# Patient Record
Sex: Male | Born: 1945 | Race: White | Hispanic: No | Marital: Married | Smoking: Never smoker
Health system: Southern US, Community
[De-identification: ages and names within clinical notes are randomized; demographics above are authoritative.]

## PROBLEM LIST (undated history)

## (undated) DIAGNOSIS — E782 Mixed hyperlipidemia: Secondary | ICD-10-CM

## (undated) DIAGNOSIS — N400 Enlarged prostate without lower urinary tract symptoms: Secondary | ICD-10-CM

## (undated) DIAGNOSIS — G2581 Restless legs syndrome: Secondary | ICD-10-CM

## (undated) DIAGNOSIS — I10 Essential (primary) hypertension: Secondary | ICD-10-CM

## (undated) DIAGNOSIS — I209 Angina pectoris, unspecified: Secondary | ICD-10-CM

## (undated) DIAGNOSIS — I319 Disease of pericardium, unspecified: Secondary | ICD-10-CM

## (undated) DIAGNOSIS — K219 Gastro-esophageal reflux disease without esophagitis: Secondary | ICD-10-CM

## (undated) DIAGNOSIS — H269 Unspecified cataract: Secondary | ICD-10-CM

## (undated) DIAGNOSIS — G4730 Sleep apnea, unspecified: Secondary | ICD-10-CM

## (undated) HISTORY — DX: Mixed hyperlipidemia: E78.2

## (undated) HISTORY — DX: Disease of pericardium, unspecified: I31.9

## (undated) HISTORY — DX: Unspecified cataract: H26.9

## (undated) HISTORY — DX: Benign prostatic hyperplasia without lower urinary tract symptoms: N40.0

## (undated) HISTORY — PX: CARDIAC CATHETERIZATION: SHX172

## (undated) HISTORY — DX: Gastro-esophageal reflux disease without esophagitis: K21.9

## (undated) HISTORY — DX: Essential (primary) hypertension: I10

## (undated) HISTORY — DX: Restless legs syndrome: G25.81

## (undated) HISTORY — DX: Angina pectoris, unspecified: I20.9

## (undated) HISTORY — DX: Sleep apnea, unspecified: G47.30

---

## 2004-07-09 ENCOUNTER — Ambulatory Visit (HOSPITAL_COMMUNITY): Admission: RE | Admit: 2004-07-09 | Discharge: 2004-07-09 | Payer: Self-pay | Admitting: Orthopedic Surgery

## 2009-07-07 ENCOUNTER — Encounter: Payer: Self-pay | Admitting: Cardiovascular Disease

## 2009-12-21 ENCOUNTER — Ambulatory Visit (HOSPITAL_BASED_OUTPATIENT_CLINIC_OR_DEPARTMENT_OTHER): Admission: RE | Admit: 2009-12-21 | Discharge: 2009-12-21 | Payer: Self-pay | Admitting: Plastic Surgery

## 2010-01-11 ENCOUNTER — Encounter: Payer: Self-pay | Admitting: Cardiovascular Disease

## 2010-01-11 ENCOUNTER — Ambulatory Visit: Payer: Self-pay | Admitting: Cardiovascular Disease

## 2010-01-11 DIAGNOSIS — I209 Angina pectoris, unspecified: Secondary | ICD-10-CM

## 2010-01-11 HISTORY — DX: Angina pectoris, unspecified: I20.9

## 2010-01-12 ENCOUNTER — Encounter: Payer: Self-pay | Admitting: Cardiovascular Disease

## 2010-01-12 ENCOUNTER — Telehealth: Payer: Self-pay | Admitting: Cardiovascular Disease

## 2010-01-31 ENCOUNTER — Ambulatory Visit: Payer: Self-pay | Admitting: Cardiovascular Disease

## 2010-01-31 LAB — CONVERTED CEMR LAB
Basophils Relative: 0.5 % (ref 0.0–3.0)
Chloride: 101 meq/L (ref 96–112)
HCT: 40.8 % (ref 39.0–52.0)
Hemoglobin: 13.8 g/dL (ref 13.0–17.0)
Lymphocytes Relative: 42.1 % (ref 12.0–46.0)
Lymphs Abs: 2.9 10*3/uL (ref 0.7–4.0)
MCHC: 33.9 g/dL (ref 30.0–36.0)
MCV: 95.1 fL (ref 78.0–100.0)
Monocytes Absolute: 0.6 10*3/uL (ref 0.1–1.0)
Monocytes Relative: 8.7 % (ref 3.0–12.0)
Neutrophils Relative %: 45.5 % (ref 43.0–77.0)
Platelets: 208 10*3/uL (ref 150.0–400.0)
Potassium: 4.4 meq/L (ref 3.5–5.1)
Prothrombin Time: 10.5 s (ref 9.7–11.8)
RDW: 13.1 % (ref 11.5–14.6)
Sodium: 139 meq/L (ref 135–145)

## 2010-02-03 ENCOUNTER — Ambulatory Visit (HOSPITAL_COMMUNITY)
Admission: RE | Admit: 2010-02-03 | Discharge: 2010-02-03 | Payer: Self-pay | Source: Home / Self Care | Attending: Cardiovascular Disease | Admitting: Cardiovascular Disease

## 2010-03-07 NOTE — Letter (Signed)
Summary: Cardiac Catheterization Instructions- Main Lab  [employer], Main Office  [address]   [place], [place] [address]   Phone: [phone]  Fax: [phone]     01/12/2010 MRN: [id number]  [facility] [address] [place], [place]  [address]  Dear Mr. CAPPELLETTI,   You are scheduled for Cardiac Catheterization on 02/03/10 with Dr. Excell Seltzer.  Please arrive at the [facility] at 8:30      a.m. on the day of your procedure.  1. DIET     __x_ Nothing to eat or drink after midnight except your medications with a sip of water.  2. Come to the [place] office on  Tuesday 01/31/10 for lab work.  The lab at [facility] is open from 8:30 a.m. to 1:30 p.m. and 2:30 p.m. to 5:00 p.m.  The lab at [facility] is open from 7:30 a.m. to 5:30 p.m.  You do not have to be fasting.  3. MAKE SURE YOU TAKE YOUR ASPIRIN.      __x__ YOU MAY TAKE ALL of your remaining medications with a small amount of water.   4. Plan for one night stay - bring personal belongings (i.e. toothpaste, toothbrush, etc.)  5. Bring a current list of your medications and current insurance cards.  6. Must have a responsible person to drive you home.   7. Someone must be with yu for the first 24 hours after you arrive home.  8. Please wear clothes that are easy to get on and off and wear slip-on shoes.  *Special note: Every effort is made to have your procedure done on time.  Occasionally there are emergencies that present themselves at the hospital that may cause delays.  Please be patient if a delay does occur.  If you have any questions after you get home, please call the office at the number listed above.  Whitney Maeola Sarah RN

## 2010-03-07 NOTE — Progress Notes (Signed)
Summary: calling to rs cath  Phone Note Call from Patient Call back at [phone]   Caller: Daughter/Randy Ramos Summary of Call: Pt calling to rs cath Initial call taken by: Judie Grieve,  January 12, 2010 2:07 PM  Follow-up for Phone Call        cath. sch. for 02/03/10 with Dr. Excell Seltzer in the main cath lab. He will get blood work drawn 01/31/10 @ the office. I will mail instructions to the patient. Whitney Maeola Sarah RN  January 12, 2010 2:35 PM  Follow-up by: Whitney Maeola Sarah RN,  January 12, 2010 2:35 PM

## 2010-03-07 NOTE — Assessment & Plan Note (Signed)
Summary: np3/sob,tightness   Visit Type:  Initial Consult Primary Lewis Keats:  Consuello Masse  CC:  Chest pressure/tightness on exertion- last episode x 1 week ago.  History of Present Illness: 65 year-old gentleman presents for initial evaluation of chest pain. He reports exertional chest pressure in the substernal area over at least the past 6 months, slowly progressive at lower level exercise. He denies rest pain or pain with low-level activity such as walking on level ground.  For the last 6 months he gets midsternal discomfort which makes him stop exercising. It resolves within 1 min. Never happens when he is not exercising. Walking does not hurt unless going up hill, would likely start having tightness after 3 flights of stairs. No h/o leg pain while walking. Pt says that he snores at night and wakes up feeling tired. Had an ETT a few years ago and then had a nuclear stress test in 2006. It wa normal per patient but the medication they gave him made him feel "sick" and his BP dropped very low during the test. He feels like his symptoms are getting a little worse over the last few months.   He has mild associated dyspnea, but denies nausea, diaphoresis, palps, or other associated symptoms.  Current Medications (verified): 1)  Nexium 40 Mg Cpdr (Esomeprazole Magnesium) .... Take 1 By Mouth Once Daily 2)  Finasteride 5 Mg Tabs (Finasteride) .... Take 1 By Mouth Once Daily 3)  Sertraline Hcl 50 Mg Tabs (Sertraline Hcl) .... Take 1 By Mouth Once Daily 4)  Tamsulosin Hcl 0.4 Mg Caps (Tamsulosin Hcl) .... Take 1 By Mouth Once Daily 5)  Lunesta 2 Mg Tabs (Eszopiclone) .... Take 1 By Mouth At Bedtime As Needed 6)  Phenazopyridine Hcl 100 Mg Tabs (Phenazopyridine Hcl) .... Take 1 By Mouth Every 8 Hours As Needed 7)  Multivitamins  Tabs (Multiple Vitamin) .... Take Q By Mouth Three Times A Day 8)  Goodys Body Pain 500-325 Mg Pack (Aspirin-Acetaminophen) .... Once Daily  Allergies (verified): No  Known Drug Allergies  Past History:  Past Medical History: Sob Chest tightness h/o bronchitis fatigue reflux urinary problems pericarditis 1995  Past Surgical History: nose surgery 1971 thumb 12-21-2009  Family History: brother has a "hole in his heart" mom: deceased at 39 (had MI before age 11) dad: deceased at 66 (stroke) 2 sisters: 1 alive, 1 deceased at age 46 (breast cancer) 2 brothers: 1 alive, 1 deceased at age 73 (MI)  Social History: no exercise, no smoking, no drugs, no alcohol, lots of stress at job doing the work of 2, plans to retire late next spring (probably before or in May, 2012).   Review of Systems       Negative except as per HPI   Vital Signs:  Patient profile:   65 year old male Height:      70 inches Weight:      196 pounds BMI:     28.22 Pulse rate:   68 / minute Pulse rhythm:   regular Resp:     12 per minute BP sitting:   132 / 78  (left arm) Cuff size:   regular  Vitals Entered By: Stanton Kidney, EMT-P (January 11, 2010 2:45 PM)  Physical Exam  General:  Pt is alert and oriented, in no acute distress. HEENT: normal Neck: normal carotid upstrokes without bruits, JVP normal Lungs: CTA CV: RRR without murmur or gallop Abd: soft, NT, positive BS, no bruit, no organomegaly Ext: no clubbing, cyanosis, or  edema. peripheral pulses 2+ and equal Skin: warm and dry without rash    EKG  Procedure date:  01/11/2010  Findings:      NSR with nonspecific T wave abnormality 68 bpm.  Impression & Recommendations:  Problem # 1:  ANGINA, STABLE (ICD-413.9) Assessment New 65 year-old male wiht highly typical symptoms of angina with exertion, progressive over the past few months. He has a high pretest probablity of CAD based on symptoms, hyperlipidemia, and family history of CAD. I have recommended cardiac catheterizaiton for definitive evaluation.  Plan to start Metoprolol 25 mg BId, ASA 325 mg daily and Nitro for angina that doesn't  resolve. Risks, benefits, and alternatives to catheterization were discussed with the patient in detail.  Pt wants to discuss cath with his wife and will make a decision and call back to make an appointment time. He was given cath instructions today.   His updated medication list for this problem includes:    Aspirin Ec 325 Mg Tbec (Aspirin) .Marland Kitchen... Take one tablet by mouth daily    Metoprolol Tartrate 25 Mg Tabs (Metoprolol tartrate) .Marland Kitchen... Take one tablet by mouth twice a day    Nitrostat 0.4 Mg Subl (Nitroglycerin) .Marland Kitchen... 1 tablet under tongue at onset of chest pain; you may repeat every 5 minutes for up to 3 doses.  Orders: EKG w/ Interpretation (93[address]) Cardiac Catheterization (Cardiac Cath)  Problem # 2:  HYPERCHOLESTEROLEMIA (ICD-272.0) Lipids reviewed from June 2011 showing Chol 176, HDL 37, and LDL 123. Will review at cath and if he has CAD will start a statin at that time.  Orders: EKG w/ Interpretation (93[address]) Cardiac Catheterization (Cardiac Cath)  Patient Instructions: 1)  Your physician has recommended you make the following change in your medication: START Aspirin 325mg  once a day, START Metoprolol tartrate 25mg  take one tablet by mouth two times a day, Nitroglycerin as directed  2)  Your physician has requested that you have a cardiac catheterization.  Cardiac catheterization is used to diagnose and/or treat various heart conditions. Doctors may recommend this procedure for a number of different reasons. The most common reason is to evaluate chest pain. Chest pain can be a symptom of coronary artery disease (CAD), and cardiac catheterization can show whether plaque is narrowing or blocking your heart's arteries. This procedure is also used to evaluate the valves, as well as measure the blood flow and oxygen levels in different parts of your heart.  For further information please visit [email or web address]  Please follow instruction sheet, as given. (THE PT WILL CALL BACK TO SCHEDULE  AFTER SPEAKING WITH FAMILY) Prescriptions: NITROSTAT 0.4 MG SUBL (NITROGLYCERIN) 1 tablet under tongue at onset of chest pain; you may repeat every 5 minutes for up to 3 doses.  #25 x 3   Entered by:   Julieta Gutting, RN, BSN   Authorized by:   Norva Karvonen, MD   Signed by:   Julieta Gutting, RN, BSN on 01/11/2010   Method used:   Electronically to        [facility]  [id number] (retail)       [address]       [place], [place]  [address]       Ph: [phone] or [phone]       Fax: [phone]   RxID:   [id number] METOPROLOL TARTRATE 25 MG TABS (METOPROLOL TARTRATE) Take one tablet by mouth twice a day  #60 x 3   Entered by:   Julieta Gutting, RN, BSN  Authorized by:   Norva Karvonen, MD   Signed by:   Julieta Gutting, RN, BSN on 01/11/2010   Method used:   [facility]ronically to        [facility]  [id number] (retail)       [address]       [place], [place]  [address]       Ph: [phone] or [phone]       Fax: [phone]   RxID:   [id number]

## 2010-03-09 NOTE — Cardiovascular Report (Signed)
Summary: Pre-Cath Orders  Pre-Cath Orders   Imported By: Marylou Mccoy 01/23/2010 18:47:04  _____________________________________________________________________  External Attachment:    Type:   Image     Comment:   External Document

## 2010-04-19 LAB — FUNGUS CULTURE W SMEAR
Fungal Smear: NONE SEEN
Fungal Smear: NONE SEEN

## 2010-04-19 LAB — TISSUE CULTURE
Gram Stain: NONE SEEN
Gram Stain: NONE SEEN

## 2010-04-19 LAB — AFB CULTURE WITH SMEAR (NOT AT ARMC): Acid Fast Smear: NONE SEEN

## 2010-04-19 LAB — ANAEROBIC CULTURE: Gram Stain: NONE SEEN

## 2015-02-02 LAB — HM COLONOSCOPY

## 2015-04-27 DIAGNOSIS — G2581 Restless legs syndrome: Secondary | ICD-10-CM | POA: Diagnosis not present

## 2015-04-27 DIAGNOSIS — J329 Chronic sinusitis, unspecified: Secondary | ICD-10-CM | POA: Diagnosis not present

## 2015-04-27 DIAGNOSIS — G4700 Insomnia, unspecified: Secondary | ICD-10-CM | POA: Diagnosis not present

## 2015-07-29 DIAGNOSIS — N401 Enlarged prostate with lower urinary tract symptoms: Secondary | ICD-10-CM | POA: Diagnosis not present

## 2015-07-29 DIAGNOSIS — R972 Elevated prostate specific antigen [PSA]: Secondary | ICD-10-CM | POA: Diagnosis not present

## 2015-07-29 DIAGNOSIS — R351 Nocturia: Secondary | ICD-10-CM | POA: Diagnosis not present

## 2015-09-15 DIAGNOSIS — T07 Unspecified multiple injuries: Secondary | ICD-10-CM | POA: Diagnosis not present

## 2015-09-15 DIAGNOSIS — T63444A Toxic effect of venom of bees, undetermined, initial encounter: Secondary | ICD-10-CM | POA: Diagnosis not present

## 2015-10-28 DIAGNOSIS — Z0000 Encounter for general adult medical examination without abnormal findings: Secondary | ICD-10-CM | POA: Diagnosis not present

## 2015-10-28 DIAGNOSIS — J40 Bronchitis, not specified as acute or chronic: Secondary | ICD-10-CM | POA: Diagnosis not present

## 2015-11-23 DIAGNOSIS — Z79899 Other long term (current) drug therapy: Secondary | ICD-10-CM | POA: Diagnosis not present

## 2015-11-23 DIAGNOSIS — N401 Enlarged prostate with lower urinary tract symptoms: Secondary | ICD-10-CM | POA: Diagnosis not present

## 2015-11-23 DIAGNOSIS — Z23 Encounter for immunization: Secondary | ICD-10-CM | POA: Diagnosis not present

## 2015-11-23 DIAGNOSIS — G2581 Restless legs syndrome: Secondary | ICD-10-CM | POA: Diagnosis not present

## 2015-11-23 DIAGNOSIS — I2510 Atherosclerotic heart disease of native coronary artery without angina pectoris: Secondary | ICD-10-CM | POA: Diagnosis not present

## 2015-11-23 DIAGNOSIS — I10 Essential (primary) hypertension: Secondary | ICD-10-CM | POA: Diagnosis not present

## 2015-11-23 DIAGNOSIS — M5416 Radiculopathy, lumbar region: Secondary | ICD-10-CM | POA: Diagnosis not present

## 2015-11-23 DIAGNOSIS — R7309 Other abnormal glucose: Secondary | ICD-10-CM | POA: Diagnosis not present

## 2015-11-23 DIAGNOSIS — E782 Mixed hyperlipidemia: Secondary | ICD-10-CM | POA: Diagnosis not present

## 2015-11-23 DIAGNOSIS — J309 Allergic rhinitis, unspecified: Secondary | ICD-10-CM | POA: Diagnosis not present

## 2015-11-23 DIAGNOSIS — R972 Elevated prostate specific antigen [PSA]: Secondary | ICD-10-CM | POA: Diagnosis not present

## 2015-11-23 DIAGNOSIS — K219 Gastro-esophageal reflux disease without esophagitis: Secondary | ICD-10-CM | POA: Diagnosis not present

## 2015-12-12 DIAGNOSIS — G4733 Obstructive sleep apnea (adult) (pediatric): Secondary | ICD-10-CM | POA: Diagnosis not present

## 2016-02-15 DIAGNOSIS — J101 Influenza due to other identified influenza virus with other respiratory manifestations: Secondary | ICD-10-CM | POA: Diagnosis not present

## 2016-02-15 DIAGNOSIS — L209 Atopic dermatitis, unspecified: Secondary | ICD-10-CM | POA: Diagnosis not present

## 2016-02-15 DIAGNOSIS — R509 Fever, unspecified: Secondary | ICD-10-CM | POA: Diagnosis not present

## 2016-07-05 DIAGNOSIS — M25562 Pain in left knee: Secondary | ICD-10-CM | POA: Diagnosis not present

## 2016-07-20 DIAGNOSIS — N401 Enlarged prostate with lower urinary tract symptoms: Secondary | ICD-10-CM | POA: Diagnosis not present

## 2016-07-20 DIAGNOSIS — R972 Elevated prostate specific antigen [PSA]: Secondary | ICD-10-CM | POA: Diagnosis not present

## 2016-07-20 DIAGNOSIS — R351 Nocturia: Secondary | ICD-10-CM | POA: Diagnosis not present

## 2016-08-02 DIAGNOSIS — G4733 Obstructive sleep apnea (adult) (pediatric): Secondary | ICD-10-CM | POA: Diagnosis not present

## 2016-09-13 DIAGNOSIS — L300 Nummular dermatitis: Secondary | ICD-10-CM | POA: Diagnosis not present

## 2016-09-13 DIAGNOSIS — L299 Pruritus, unspecified: Secondary | ICD-10-CM | POA: Diagnosis not present

## 2016-09-13 DIAGNOSIS — D225 Melanocytic nevi of trunk: Secondary | ICD-10-CM | POA: Diagnosis not present

## 2016-09-13 DIAGNOSIS — L820 Inflamed seborrheic keratosis: Secondary | ICD-10-CM | POA: Diagnosis not present

## 2016-09-13 DIAGNOSIS — D1801 Hemangioma of skin and subcutaneous tissue: Secondary | ICD-10-CM | POA: Diagnosis not present

## 2016-09-18 DIAGNOSIS — J358 Other chronic diseases of tonsils and adenoids: Secondary | ICD-10-CM | POA: Diagnosis not present

## 2016-09-18 DIAGNOSIS — R490 Dysphonia: Secondary | ICD-10-CM | POA: Diagnosis not present

## 2016-09-18 DIAGNOSIS — J3489 Other specified disorders of nose and nasal sinuses: Secondary | ICD-10-CM | POA: Diagnosis not present

## 2016-09-18 DIAGNOSIS — K219 Gastro-esophageal reflux disease without esophagitis: Secondary | ICD-10-CM | POA: Diagnosis not present

## 2016-10-18 DIAGNOSIS — J358 Other chronic diseases of tonsils and adenoids: Secondary | ICD-10-CM | POA: Diagnosis not present

## 2016-10-18 DIAGNOSIS — R490 Dysphonia: Secondary | ICD-10-CM | POA: Diagnosis not present

## 2016-11-15 DIAGNOSIS — Z0001 Encounter for general adult medical examination with abnormal findings: Secondary | ICD-10-CM | POA: Diagnosis not present

## 2016-11-15 DIAGNOSIS — G2581 Restless legs syndrome: Secondary | ICD-10-CM | POA: Diagnosis not present

## 2016-11-15 DIAGNOSIS — M545 Low back pain: Secondary | ICD-10-CM | POA: Diagnosis not present

## 2016-11-15 DIAGNOSIS — R7309 Other abnormal glucose: Secondary | ICD-10-CM | POA: Diagnosis not present

## 2016-11-15 DIAGNOSIS — J329 Chronic sinusitis, unspecified: Secondary | ICD-10-CM | POA: Diagnosis not present

## 2016-11-15 DIAGNOSIS — N401 Enlarged prostate with lower urinary tract symptoms: Secondary | ICD-10-CM | POA: Diagnosis not present

## 2016-11-21 DIAGNOSIS — R490 Dysphonia: Secondary | ICD-10-CM | POA: Diagnosis not present

## 2017-01-02 ENCOUNTER — Encounter: Payer: Self-pay | Admitting: Cardiovascular Disease

## 2017-01-02 ENCOUNTER — Encounter (INDEPENDENT_AMBULATORY_CARE_PROVIDER_SITE_OTHER): Payer: Self-pay

## 2017-01-02 ENCOUNTER — Ambulatory Visit: Payer: PPO | Admitting: Cardiovascular Disease

## 2017-01-02 VITALS — BP 138/80 | HR 74 | Ht 69.0 in | Wt 207.2 lb

## 2017-01-02 DIAGNOSIS — I208 Other forms of angina pectoris: Secondary | ICD-10-CM | POA: Diagnosis not present

## 2017-01-02 DIAGNOSIS — R0602 Shortness of breath: Secondary | ICD-10-CM

## 2017-01-02 NOTE — Patient Instructions (Signed)
Medication Instructions:  Your provider recommends that you continue on your current medications as directed. Please refer to the Current Medication list given to you today.    Labwork: None  Testing/Procedures: Your physician has requested that you have a stress echocardiogram. For further information please visit [email or web address]. Please follow instruction sheet as given.  Follow-Up: Your provider wants you to follow-up in: 1 year with Dr. Burt Knack. You will receive a reminder letter in the mail two months in advance. If you don't receive a letter, please call our office to schedule the follow-up appointment.    Any Other Special Instructions Will Be Listed Below (If Applicable).     If you need a refill on your cardiac medications before your next appointment, please call your pharmacy.

## 2017-01-02 NOTE — Progress Notes (Signed)
Cardiology Office Note Date:  01/02/2017   ID:  Randy, Ramos 05-Apr-1945, MRN [id number]  PCP:  Randy Broker, MD  Cardiologist:  Randy Mocha, MD    Chief Complaint  Patient presents with  . Palpitations     History of Present Illness: Randy Ramos is a 71 y.o. male who presents to reestablish cardiac care.  He was last seen in 2011.  He presented with exertional angina with highly typical symptoms.  Considering his high pretest probability of CAD, cardiac catheterization was recommended.  This demonstrated minimal nonobstructive coronary artery disease and normal LV function.  With physical work he complains of exertional dyspnea.  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  He would be short of breath with walking 1-2 flights of stairs, but states he can walk well on level ground.  He denies chest pain or pressure.  He denies orthopnea, PND, leg swelling, or heart palpitations.  His breathing is worse over the last 6-12 months.  Past Medical History:  Diagnosis Date  . ANGINA, STABLE 01/11/2010   Qualifier: Diagnosis of  By: Owens Shark, RN, BSN, Randy        Current Outpatient Medications  Medication Sig Dispense Refill  . Coenzyme Q10-Vitamin E (QUNOL ULTRA COQ10 PO) Take 1 capsule by mouth daily.    Marland Kitchen ezetimibe (ZETIA) 10 MG tablet Take 10 mg by mouth daily.  2  . finasteride (PROSCAR) 5 MG tablet Take 5 mg by mouth daily.  3  . metoprolol succinate (TOPROL-XL) 25 MG 24 hr tablet Take 25 mg by mouth daily.  3  . Omega-3 Fatty Acids (FISH OIL PO) Take 1 capsule by mouth daily.    . pantoprazole (PROTONIX) 40 MG tablet Take 40 mg by mouth daily.    . pramipexole (MIRAPEX) 0.125 MG tablet Take 1-4 tablets by mouth at bedtime.  0  . tamsulosin (FLOMAX) 0.4 MG CAPS capsule Take 0.8 mg by mouth at bedtime.  3  . traMADol (ULTRAM) 50 MG tablet Take 50 mg by mouth every 8 (eight) hours as needed for pain.  0   No current  facility-administered medications for this visit.     Allergies:   Patient has no allergy information on record.   Social History:  The patient  reports that  has never smoked. He has quit using smokeless tobacco. He reports that he does not drink alcohol.   Family History:  The patient's family history includes Hypertension in his father and mother.    ROS:  Please see the history of present illness.  Otherwise, review of systems is positive for snoring, back pain.  All other systems are reviewed and negative.    PHYSICAL EXAM: VS:  BP 138/80   Pulse 74   Ht 5\' 9"  (1.753 m)   Wt 207 lb 3.2 oz (94 kg)   BMI 30.60 kg/m  , BMI Body mass index is 30.6 kg/m. GEN: Well nourished, well developed, in no acute distress  HEENT: normal  Neck: no JVD, no masses. No carotid bruits Cardiac: RRR without murmur or gallop      Respiratory:  clear to auscultation bilaterally, normal work of breathing GI: soft, nontender, nondistended, + BS MS: no deformity or atrophy  Ext: no pretibial edema, pedal pulses 2+= bilaterally Skin: warm and dry, no rash Neuro:  Strength and sensation are intact Psych: euthymic mood, full affect  EKG:  EKG is ordered  today. The ekg ordered today shows NSR 74 bpm, minimal voltage criteria for LVH, otherwise normal  Recent Labs: No results found for requested labs within last 8760 hours.   Lipid Panel  No results found for: CHOL, TRIG, HDL, CHOLHDL, VLDL, LDLCALC, LDLDIRECT    Wt Readings from Last 3 Encounters:  01/02/17 207 lb 3.2 oz (94 kg)     Cardiac Studies Reviewed: Cardiac Cath 2011: Study reviewed with 30% proximal LAD stenosis, otherwise widely patent coronary arteries.  Normal LV function.  ASSESSMENT AND PLAN: 1.  Exertional dyspnea: previous evaluation reviewed, specifically cardiac cath findings. Last testing done in 2011, now with exertional dyspnea. I have recommended a stress echo to further evaluate causes of exertional dyspnea and rule  out myocardial ischemia. Exam/symptoms not suggestive of lung disease.   2. HTN: controlled. Continue same Rx.   3. Hyperlipidemia: followed by Dr Randy Ramos. Pt has problems with statin intolerance. Taking CoQ10 and low-dose statin intermittently.   Current medicines are reviewed with the patient today.  The patient does not have concerns regarding medicines.  Labs/ tests ordered today include:   Orders Placed This Encounter  Procedures  . EKG 12-Lead  . ECHOCARDIOGRAM STRESS TEST    Disposition:   FU one year  Signed, Randy Mocha, MD  01/02/2017 5:37 PM    Randy Ramos, Randy Ramos, Randy Ramos  [address] Phone: [phone]; Fax: [phone]

## 2017-01-21 DIAGNOSIS — J3089 Other allergic rhinitis: Secondary | ICD-10-CM | POA: Diagnosis not present

## 2017-01-21 DIAGNOSIS — M5431 Sciatica, right side: Secondary | ICD-10-CM | POA: Diagnosis not present

## 2017-02-07 ENCOUNTER — Telehealth (HOSPITAL_COMMUNITY): Payer: Self-pay | Admitting: *Deleted

## 2017-02-07 NOTE — Telephone Encounter (Signed)
Left message on voicemail in reference to upcoming appointment scheduled for 02/14/16. Phone number given for a call back so details instructions can be given. Randy Ramos

## 2017-02-13 ENCOUNTER — Ambulatory Visit (HOSPITAL_COMMUNITY): Payer: PPO

## 2017-02-13 ENCOUNTER — Encounter (INDEPENDENT_AMBULATORY_CARE_PROVIDER_SITE_OTHER): Payer: Self-pay

## 2017-02-13 ENCOUNTER — Ambulatory Visit (HOSPITAL_COMMUNITY): Payer: PPO | Attending: Cardiovascular Disease

## 2017-02-13 ENCOUNTER — Telehealth: Payer: Self-pay | Admitting: Cardiovascular Disease

## 2017-02-13 DIAGNOSIS — R000 Tachycardia, unspecified: Secondary | ICD-10-CM | POA: Diagnosis not present

## 2017-02-13 DIAGNOSIS — R0602 Shortness of breath: Secondary | ICD-10-CM

## 2017-02-13 DIAGNOSIS — I420 Dilated cardiomyopathy: Secondary | ICD-10-CM | POA: Diagnosis not present

## 2017-02-13 DIAGNOSIS — I208 Other forms of angina pectoris: Secondary | ICD-10-CM | POA: Diagnosis not present

## 2017-02-13 MED ORDER — METOPROLOL SUCCINATE ER 25 MG PO TB24: 25.0000 mg | ORAL_TABLET | Freq: Every day | ORAL | 3 refills | Status: DC

## 2017-02-13 NOTE — Telephone Encounter (Signed)
Rx refilled.

## 2017-02-13 NOTE — Telephone Encounter (Signed)
New message     *STAT* If patient is at the pharmacy, call can be transferred to refill team.   1. Which medications need to be refilled? (please list name of each medication and dose if known) metoprolol 25 mg  2. Which pharmacy/location (including street and city if local pharmacy) is medication to be sent to? [facility] [place] [phone]  3. Do they need a 30 day or 90 day supply? 90 day

## 2017-03-03 DIAGNOSIS — J069 Acute upper respiratory infection, unspecified: Secondary | ICD-10-CM | POA: Diagnosis not present

## 2017-03-03 DIAGNOSIS — J111 Influenza due to unidentified influenza virus with other respiratory manifestations: Secondary | ICD-10-CM | POA: Diagnosis not present

## 2017-08-09 DIAGNOSIS — M170 Bilateral primary osteoarthritis of knee: Secondary | ICD-10-CM | POA: Diagnosis not present

## 2017-09-04 DIAGNOSIS — N401 Enlarged prostate with lower urinary tract symptoms: Secondary | ICD-10-CM | POA: Diagnosis not present

## 2017-09-04 DIAGNOSIS — R351 Nocturia: Secondary | ICD-10-CM | POA: Diagnosis not present

## 2017-09-04 DIAGNOSIS — R972 Elevated prostate specific antigen [PSA]: Secondary | ICD-10-CM | POA: Diagnosis not present

## 2017-11-05 DIAGNOSIS — D225 Melanocytic nevi of trunk: Secondary | ICD-10-CM | POA: Diagnosis not present

## 2017-11-05 DIAGNOSIS — D2239 Melanocytic nevi of other parts of face: Secondary | ICD-10-CM | POA: Diagnosis not present

## 2017-11-05 DIAGNOSIS — L299 Pruritus, unspecified: Secondary | ICD-10-CM | POA: Diagnosis not present

## 2017-11-05 DIAGNOSIS — L821 Other seborrheic keratosis: Secondary | ICD-10-CM | POA: Diagnosis not present

## 2017-11-05 DIAGNOSIS — L570 Actinic keratosis: Secondary | ICD-10-CM | POA: Diagnosis not present

## 2017-11-05 DIAGNOSIS — L820 Inflamed seborrheic keratosis: Secondary | ICD-10-CM | POA: Diagnosis not present

## 2017-11-18 DIAGNOSIS — R5381 Other malaise: Secondary | ICD-10-CM | POA: Diagnosis not present

## 2017-11-18 DIAGNOSIS — N138 Other obstructive and reflux uropathy: Secondary | ICD-10-CM | POA: Diagnosis not present

## 2017-11-18 DIAGNOSIS — Z0001 Encounter for general adult medical examination with abnormal findings: Secondary | ICD-10-CM | POA: Diagnosis not present

## 2017-11-18 DIAGNOSIS — E782 Mixed hyperlipidemia: Secondary | ICD-10-CM | POA: Diagnosis not present

## 2017-11-18 DIAGNOSIS — I10 Essential (primary) hypertension: Secondary | ICD-10-CM | POA: Diagnosis not present

## 2017-11-18 DIAGNOSIS — F329 Major depressive disorder, single episode, unspecified: Secondary | ICD-10-CM | POA: Diagnosis not present

## 2017-11-18 DIAGNOSIS — Z125 Encounter for screening for malignant neoplasm of prostate: Secondary | ICD-10-CM | POA: Diagnosis not present

## 2017-11-18 DIAGNOSIS — N401 Enlarged prostate with lower urinary tract symptoms: Secondary | ICD-10-CM | POA: Diagnosis not present

## 2017-11-18 DIAGNOSIS — G2581 Restless legs syndrome: Secondary | ICD-10-CM | POA: Diagnosis not present

## 2017-11-18 DIAGNOSIS — R5383 Other fatigue: Secondary | ICD-10-CM | POA: Diagnosis not present

## 2017-11-18 DIAGNOSIS — K219 Gastro-esophageal reflux disease without esophagitis: Secondary | ICD-10-CM | POA: Diagnosis not present

## 2017-11-18 DIAGNOSIS — Z1211 Encounter for screening for malignant neoplasm of colon: Secondary | ICD-10-CM | POA: Diagnosis not present

## 2018-02-27 DIAGNOSIS — J40 Bronchitis, not specified as acute or chronic: Secondary | ICD-10-CM | POA: Diagnosis not present

## 2018-02-27 DIAGNOSIS — J329 Chronic sinusitis, unspecified: Secondary | ICD-10-CM | POA: Diagnosis not present

## 2018-04-10 ENCOUNTER — Ambulatory Visit: Payer: PPO | Admitting: Cardiovascular Disease

## 2018-04-10 ENCOUNTER — Encounter: Payer: Self-pay | Admitting: Cardiovascular Disease

## 2018-04-10 VITALS — BP 136/72 | HR 65 | Ht 69.0 in | Wt 206.4 lb

## 2018-04-10 DIAGNOSIS — I10 Essential (primary) hypertension: Secondary | ICD-10-CM | POA: Diagnosis not present

## 2018-04-10 DIAGNOSIS — E785 Hyperlipidemia, unspecified: Secondary | ICD-10-CM | POA: Diagnosis not present

## 2018-04-10 NOTE — Progress Notes (Signed)
Cardiology Office Note:    Date:  04/11/2018   ID:  ZHAMIR PIRRO, DOB 09/23/1945, MRN [id number]  PCP:  Myrlene Broker, MD  Cardiologist:  Sherren Mocha, MD  Electrophysiologist:  None   Referring MD: Myrlene Broker, MD   No chief complaint on file.   History of Present Illness:    Randy Ramos is a 73 y.o. male with a hx of minor nonobstructive CAD and hyperlipidemia, presenting for follow-up evaluation.  He underwent cardiac catheterization in 2011 after presenting with exertional angina.  This showed patent coronary arteries with minimal coronary disease.  He is done well in the interim.  He is physically active with no exertional symptoms.  He denies chest pain, shortness of breath, edema, or heart palpitations.  Past Medical History:  Diagnosis Date  . ANGINA, STABLE 01/11/2010   Qualifier: Diagnosis of  By: Owens Shark, RN, BSN, Lauren      History reviewed. No pertinent surgical history.  Current Medications: Current Meds  Medication Sig  . Coenzyme Q10-Vitamin E (QUNOL ULTRA COQ10 PO) Take 1 capsule by mouth daily.  Marland Kitchen ezetimibe (ZETIA) 10 MG tablet Take 10 mg by mouth daily.  . finasteride (PROSCAR) 5 MG tablet Take 5 mg by mouth daily.  . metoprolol succinate (TOPROL-XL) 25 MG 24 hr tablet Take 1 tablet (25 mg total) by mouth daily.  . Omega-3 Fatty Acids (FISH OIL PO) Take 1 capsule by mouth daily.  . pantoprazole (PROTONIX) 40 MG tablet Take 40 mg by mouth daily.  . pramipexole (MIRAPEX) 0.125 MG tablet Take 1-4 tablets by mouth at bedtime.  . sertraline (ZOLOFT) 50 MG tablet Take 1 tablet by mouth daily.  . tamsulosin (FLOMAX) 0.4 MG CAPS capsule Take 0.8 mg by mouth at bedtime.  . traZODone (DESYREL) 50 MG tablet Take 50 mg by mouth at bedtime as needed for sleep.     Allergies:   Patient has no known allergies.   Social History   Socioeconomic History  . Marital status: Married    Spouse name: Not on file  . Number of children: Not on file  . Years of  education: Not on file  . Highest education level: Not on file  Occupational History  . Not on file  Social Needs  . Financial resource strain: Not on file  . Food insecurity:    Worry: Not on file    Inability: Not on file  . Transportation needs:    Medical: Not on file    Non-medical: Not on file  Tobacco Use  . Smoking status: Never Smoker  . Smokeless tobacco: Former Network engineer and Sexual Activity  . Alcohol use: No    Frequency: Never  . Drug use: Not on file  . Sexual activity: Not on file  Lifestyle  . Physical activity:    Days per week: Not on file    Minutes per session: Not on file  . Stress: Not on file  Relationships  . Social connections:    Talks on phone: Not on file    Gets together: Not on file    Attends religious service: Not on file    Active member of club or organization: Not on file    Attends meetings of clubs or organizations: Not on file    Relationship status: Not on file  Other Topics Concern  . Not on file  Social History Narrative  . Not on file     Family History: The patient's family  history includes Hypertension in his father and mother.  ROS:   Please see the history of present illness.    All other systems reviewed and are negative.  EKGs/Labs/Other Studies Reviewed:    EKG:  EKG is ordered today.  The ekg ordered today demonstrates normal sinus rhythm 65 bpm, minimal voltage criteria for LVH may be normal variant, nonspecific ST and T wave abnormality.  Recent Labs: No results found for requested labs within last 8760 hours.  Recent Lipid Panel No results found for: CHOL, TRIG, HDL, CHOLHDL, VLDL, LDLCALC, LDLDIRECT  Physical Exam:    VS:  BP 136/72   Pulse 65   Ht 5\' 9"  (1.753 m)   Wt 206 lb 6.4 oz (93.6 kg)   SpO2 91%   BMI 30.48 kg/m     Wt Readings from Last 3 Encounters:  04/10/18 206 lb 6.4 oz (93.6 kg)  01/02/17 207 lb 3.2 oz (94 kg)     GEN: Well nourished, well developed in no acute  distress HEENT: Normal NECK: No JVD; No carotid bruits LYMPHATICS: No lymphadenopathy CARDIAC: RRR, no murmurs, rubs, gallops RESPIRATORY:  Clear to auscultation without rales, wheezing or rhonchi  ABDOMEN: Soft, non-tender, non-distended MUSCULOSKELETAL:  No edema; No deformity  SKIN: Warm and dry NEUROLOGIC:  Alert and oriented x 3 PSYCHIATRIC:  Normal affect   ASSESSMENT:    1. Hypertension, unspecified type   2. Hyperlipidemia, unspecified hyperlipidemia type    PLAN:    In order of problems listed above:  1. The patient appears stable.  His blood pressure is well controlled on metoprolol succinate.  He remains active and has no exertional symptoms. 2. The patient is statin intolerant and he takes ezetimibe.  He has not had recent labs but he is followed by his primary care physician.   Medication Adjustments/Labs and Tests Ordered: Current medicines are reviewed at length with the patient today.  Concerns regarding medicines are outlined above.  Orders Placed This Encounter  Procedures  . EKG 12-Lead   No orders of the defined types were placed in this encounter.   Patient Instructions  Medication Instructions:  Your provider recommends that you continue on your current medications as directed. Please refer to the Current Medication list given to you today.   If you need a refill on your cardiac medications before your next appointment, please call your pharmacy.    Follow-Up: At [facility], you and your health needs are our priority.  As part of our continuing mission to provide you with exceptional heart care, we have created designated Provider Care Teams.  These Care Teams include your primary Cardiologist (physician) and Advanced Practice Providers (APPs -  Physician Assistants and Nurse Practitioners) who all work together to provide you with the care you need, when you need it. You will need a follow up appointment in:  12 months.  Please call our office 2  months in advance to schedule this appointment.  You may see Sherren Mocha, MD or one of the following Advanced Practice Providers on your designated Care Team: Richardson Dopp, PA-C [place], [place] . Daune Perch, NP     Signed, Sherren Mocha, MD  04/11/2018 5:44 PM    Randy Ramos

## 2018-04-10 NOTE — Patient Instructions (Signed)
Medication Instructions:  Your provider recommends that you continue on your current medications as directed. Please refer to the Current Medication list given to you today.   If you need a refill on your cardiac medications before your next appointment, please call your pharmacy.    Follow-Up: At [facility], you and your health needs are our priority.  As part of our continuing mission to provide you with exceptional heart care, we have created designated Provider Care Teams.  These Care Teams include your primary Cardiologist (physician) and Advanced Practice Providers (APPs -  Physician Assistants and Nurse Practitioners) who all work together to provide you with the care you need, when you need it. You will need a follow up appointment in:  12 months.  Please call our office 2 months in advance to schedule this appointment.  You may see Michael Cooper, MD or one of the following Advanced Practice Providers on your designated Care Team: Scott Weaver, PA-C Vin Bhagat, PA-C . Janine Hammond, NP      

## 2018-04-11 ENCOUNTER — Encounter: Payer: Self-pay | Admitting: Cardiovascular Disease

## 2018-06-10 DIAGNOSIS — J3089 Other allergic rhinitis: Secondary | ICD-10-CM | POA: Diagnosis not present

## 2018-10-01 DIAGNOSIS — N401 Enlarged prostate with lower urinary tract symptoms: Secondary | ICD-10-CM | POA: Diagnosis not present

## 2018-10-01 DIAGNOSIS — R972 Elevated prostate specific antigen [PSA]: Secondary | ICD-10-CM | POA: Diagnosis not present

## 2018-10-01 DIAGNOSIS — R351 Nocturia: Secondary | ICD-10-CM | POA: Diagnosis not present

## 2018-10-06 DIAGNOSIS — B353 Tinea pedis: Secondary | ICD-10-CM | POA: Diagnosis not present

## 2018-10-06 DIAGNOSIS — M170 Bilateral primary osteoarthritis of knee: Secondary | ICD-10-CM | POA: Diagnosis not present

## 2018-10-06 DIAGNOSIS — J3089 Other allergic rhinitis: Secondary | ICD-10-CM | POA: Diagnosis not present

## 2018-12-16 DIAGNOSIS — K219 Gastro-esophageal reflux disease without esophagitis: Secondary | ICD-10-CM | POA: Diagnosis not present

## 2018-12-16 DIAGNOSIS — E782 Mixed hyperlipidemia: Secondary | ICD-10-CM | POA: Diagnosis not present

## 2018-12-16 DIAGNOSIS — E559 Vitamin D deficiency, unspecified: Secondary | ICD-10-CM | POA: Diagnosis not present

## 2018-12-16 DIAGNOSIS — R7309 Other abnormal glucose: Secondary | ICD-10-CM | POA: Diagnosis not present

## 2018-12-16 DIAGNOSIS — Z23 Encounter for immunization: Secondary | ICD-10-CM | POA: Diagnosis not present

## 2018-12-16 DIAGNOSIS — N138 Other obstructive and reflux uropathy: Secondary | ICD-10-CM | POA: Diagnosis not present

## 2018-12-16 DIAGNOSIS — N401 Enlarged prostate with lower urinary tract symptoms: Secondary | ICD-10-CM | POA: Diagnosis not present

## 2018-12-16 DIAGNOSIS — Z0000 Encounter for general adult medical examination without abnormal findings: Secondary | ICD-10-CM | POA: Diagnosis not present

## 2018-12-16 DIAGNOSIS — G2581 Restless legs syndrome: Secondary | ICD-10-CM | POA: Diagnosis not present

## 2018-12-16 DIAGNOSIS — F3342 Major depressive disorder, recurrent, in full remission: Secondary | ICD-10-CM | POA: Diagnosis not present

## 2018-12-16 DIAGNOSIS — M545 Low back pain: Secondary | ICD-10-CM | POA: Diagnosis not present

## 2018-12-16 DIAGNOSIS — G8929 Other chronic pain: Secondary | ICD-10-CM | POA: Diagnosis not present

## 2019-01-22 DIAGNOSIS — K219 Gastro-esophageal reflux disease without esophagitis: Secondary | ICD-10-CM | POA: Diagnosis not present

## 2019-02-04 DIAGNOSIS — K228 Other specified diseases of esophagus: Secondary | ICD-10-CM | POA: Diagnosis not present

## 2019-02-04 DIAGNOSIS — R12 Heartburn: Secondary | ICD-10-CM | POA: Diagnosis not present

## 2019-02-04 DIAGNOSIS — Z1211 Encounter for screening for malignant neoplasm of colon: Secondary | ICD-10-CM | POA: Diagnosis not present

## 2019-02-04 DIAGNOSIS — Z86010 Personal history of colonic polyps: Secondary | ICD-10-CM | POA: Diagnosis not present

## 2019-02-04 DIAGNOSIS — K200 Eosinophilic esophagitis: Secondary | ICD-10-CM | POA: Diagnosis not present

## 2019-02-04 DIAGNOSIS — K219 Gastro-esophageal reflux disease without esophagitis: Secondary | ICD-10-CM | POA: Diagnosis not present

## 2019-02-04 LAB — HM COLONOSCOPY

## 2019-04-24 DIAGNOSIS — L299 Pruritus, unspecified: Secondary | ICD-10-CM | POA: Diagnosis not present

## 2019-04-24 DIAGNOSIS — L578 Other skin changes due to chronic exposure to nonionizing radiation: Secondary | ICD-10-CM | POA: Diagnosis not present

## 2019-04-24 DIAGNOSIS — L820 Inflamed seborrheic keratosis: Secondary | ICD-10-CM | POA: Diagnosis not present

## 2019-06-01 DIAGNOSIS — G2581 Restless legs syndrome: Secondary | ICD-10-CM | POA: Diagnosis not present

## 2019-06-01 DIAGNOSIS — I87393 Chronic venous hypertension (idiopathic) with other complications of bilateral lower extremity: Secondary | ICD-10-CM | POA: Diagnosis not present

## 2019-06-18 DIAGNOSIS — I87393 Chronic venous hypertension (idiopathic) with other complications of bilateral lower extremity: Secondary | ICD-10-CM | POA: Diagnosis not present

## 2019-06-18 DIAGNOSIS — G2581 Restless legs syndrome: Secondary | ICD-10-CM | POA: Diagnosis not present

## 2019-06-24 DIAGNOSIS — H00015 Hordeolum externum left lower eyelid: Secondary | ICD-10-CM | POA: Diagnosis not present

## 2019-07-15 DIAGNOSIS — H0015 Chalazion left lower eyelid: Secondary | ICD-10-CM | POA: Diagnosis not present

## 2019-08-25 DIAGNOSIS — Z77098 Contact with and (suspected) exposure to other hazardous, chiefly nonmedicinal, chemicals: Secondary | ICD-10-CM | POA: Diagnosis not present

## 2019-08-25 DIAGNOSIS — G2581 Restless legs syndrome: Secondary | ICD-10-CM | POA: Diagnosis not present

## 2019-08-25 DIAGNOSIS — R55 Syncope and collapse: Secondary | ICD-10-CM | POA: Diagnosis not present

## 2019-08-25 DIAGNOSIS — F5104 Psychophysiologic insomnia: Secondary | ICD-10-CM | POA: Diagnosis not present

## 2019-10-05 DIAGNOSIS — R351 Nocturia: Secondary | ICD-10-CM | POA: Diagnosis not present

## 2019-10-05 DIAGNOSIS — R972 Elevated prostate specific antigen [PSA]: Secondary | ICD-10-CM | POA: Diagnosis not present

## 2019-10-05 DIAGNOSIS — N401 Enlarged prostate with lower urinary tract symptoms: Secondary | ICD-10-CM | POA: Diagnosis not present

## 2019-11-20 ENCOUNTER — Ambulatory Visit: Payer: PPO | Admitting: Cardiovascular Disease

## 2019-12-02 ENCOUNTER — Other Ambulatory Visit: Payer: Self-pay

## 2019-12-02 ENCOUNTER — Encounter: Payer: Self-pay | Admitting: Cardiovascular Disease

## 2019-12-02 ENCOUNTER — Ambulatory Visit: Payer: PPO | Admitting: Cardiovascular Disease

## 2019-12-02 VITALS — BP 120/66 | HR 55 | Ht 69.0 in | Wt 189.0 lb

## 2019-12-02 DIAGNOSIS — E782 Mixed hyperlipidemia: Secondary | ICD-10-CM | POA: Diagnosis not present

## 2019-12-02 MED ORDER — ROSUVASTATIN CALCIUM 10 MG PO TABS: 10.0000 mg | ORAL_TABLET | Freq: Every day | ORAL | 3 refills | Status: DC

## 2019-12-02 NOTE — Progress Notes (Signed)
Cardiology Office Note:    Date:  12/02/2019   ID:  Randy Ramos, DOB 20-Feb-1945, MRN [id number]  PCP:  Myrlene Broker, MD  [facility] Cardiologist:  Sherren Mocha, MD  [place] Electrophysiologist:  None   Referring MD: Myrlene Broker, MD   Chief Complaint  Patient presents with  . Hyperlipidemia    History of Present Illness:    Randy Ramos is a 74 y.o. male with a hx of nonobstructive coronary artery disease and mixed hyperlipidemia, presenting for follow-up evaluation.  The patient underwent cardiac catheterization in 2011 when he presented with exertional angina.  This demonstrated widely patent coronary arteries with minimal nonobstructive CAD.  The patient is here alone today.  He has been doing very well.  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  He is out working most of the day on a regular basis.  He denies chest pain, chest pressure, or shortness of breath.  He denies leg swelling or heart palpitations.  Past Medical History:  Diagnosis Date  . ANGINA, STABLE 01/11/2010   Qualifier: Diagnosis of  By: Owens Shark, RN, BSN, Lauren      History reviewed. No pertinent surgical history.  Current Medications: Current Meds  Medication Sig  . chlorhexidine (PERIDEX) 0.12 % solution Swish 15 MILLILITERS in the mouth or throat 2 times daily.  . Coenzyme Q10-Vitamin E (QUNOL ULTRA COQ10 PO) Take 1 capsule by mouth daily.  Marland Kitchen ezetimibe (ZETIA) 10 MG tablet Take 10 mg by mouth daily.  . finasteride (PROSCAR) 5 MG tablet Take 5 mg by mouth daily.  . Omega-3 Fatty Acids (FISH OIL PO) Take 1 capsule by mouth daily.  . pantoprazole (PROTONIX) 40 MG tablet Take 40 mg by mouth daily.  . pramipexole (MIRAPEX) 0.125 MG tablet Take 1-4 tablets by mouth at bedtime.  . sertraline (ZOLOFT) 50 MG tablet Take 1 tablet by mouth daily.  . tamsulosin (FLOMAX) 0.4 MG CAPS capsule Take 0.8 mg by mouth at bedtime.  . traMADol (ULTRAM) 50 MG tablet Take by mouth.  . traZODone (DESYREL) 50 MG  tablet Take 50 mg by mouth at bedtime as needed for sleep.     Allergies:   Patient has no known allergies.   Social History   Socioeconomic History  . Marital status: Married    Spouse name: Not on file  . Number of children: Not on file  . Years of education: Not on file  . Highest education level: Not on file  Occupational History  . Not on file  Tobacco Use  . Smoking status: Never Smoker  . Smokeless tobacco: Former Network engineer and Sexual Activity  . Alcohol use: No  . Drug use: Not on file  . Sexual activity: Not on file  Other Topics Concern  . Not on file  Social History Narrative  . Not on file   Social Determinants of Health   Financial Resource Strain:   . Difficulty of Paying Living Expenses: Not on file  Food Insecurity:   . Worried About Charity fundraiser in the Last Year: Not on file  . Ran Out of Food in the Last Year: Not on file  Transportation Needs:   . Lack of Transportation (Medical): Not on file  . Lack of Transportation (Non-Medical): Not on file  Physical Activity:   . Days of Exercise per Week: Not on file  . Minutes of Exercise per Session: Not on file  Stress:   . Feeling of Stress :  Not on file  Social Connections:   . Frequency of Communication with Friends and Family: Not on file  . Frequency of Social Gatherings with Friends and Family: Not on file  . Attends Religious Services: Not on file  . Active Member of Clubs or Organizations: Not on file  . Attends Archivist Meetings: Not on file  . Marital Status: Not on file     Family History: The patient's family history includes Hypertension in his father and mother.  ROS:   Please see the history of present illness.    All other systems reviewed and are negative.  EKGs/Labs/Other Studies Reviewed:    EKG:  EKG is ordered today.  The ekg ordered today demonstrates sinus bradycardia 55 bpm, otherwise within normal limits.  Recent Labs: No results found for  requested labs within last 8760 hours.  Recent Lipid Panel No results found for: CHOL, TRIG, HDL, CHOLHDL, VLDL, LDLCALC, LDLDIRECT   Risk Assessment/Calculations:       Physical Exam:    VS:  BP 120/66   Pulse (!) 55   Ht 5\' 9"  (1.753 m)   Wt 189 lb (85.7 kg)   SpO2 94%   BMI 27.91 kg/m     Wt Readings from Last 3 Encounters:  12/02/19 189 lb (85.7 kg)  04/10/18 206 lb 6.4 oz (93.6 kg)  01/02/17 207 lb 3.2 oz (94 kg)     GEN: Well nourished, well developed in no acute distress HEENT: Normal NECK: No JVD; No carotid bruits LYMPHATICS: No lymphadenopathy CARDIAC: RRR, no murmurs, rubs, gallops RESPIRATORY:  Clear to auscultation without rales, wheezing or rhonchi  ABDOMEN: Soft, non-tender, non-distended MUSCULOSKELETAL:  No edema; No deformity  SKIN: Warm and dry NEUROLOGIC:  Alert and oriented x 3 PSYCHIATRIC:  Normal affect   ASSESSMENT:    1. Mixed hyperlipidemia    PLAN:    In order of problems listed above:  1. Reviewed treatment options with the patient.  He has had some problems tolerating statin drugs in the past but this seems to be ill-defined.  He remembers "just not feeling well."  We reviewed his specific criteria and a risk calculator today.  He should be treated with a statin drug based on his most recent lipid panel which demonstrates an LDL cholesterol 140 mg/dL.  He is willing to try rosuvastatin 10 mg daily.  We will repeat lipids and LFTs in 3 months.  We discussed potential side effects.  He will let me know if he is having any problems tolerating this.  Otherwise he seems to be doing very well and I will plan to see him back in 1 year for follow-up.   Medication Adjustments/Labs and Tests Ordered: Current medicines are reviewed at length with the patient today.  Concerns regarding medicines are outlined above.  Orders Placed This Encounter  Procedures  . Lipid panel  . Hepatic function panel  . EKG 12-Lead   Meds ordered this encounter    Medications  . rosuvastatin (CRESTOR) 10 MG tablet    Sig: Take 1 tablet (10 mg total) by mouth daily.    Dispense:  90 tablet    Refill:  3    Patient Instructions  Medication Instructions:  1) START CRESTOR (rosuvastatin) 10 mg daily *If you need a refill on your cardiac medications before your next appointment, please call your pharmacy*  Lab Work: Your provider recommends that you have FASTING lab work in 3 months. Please take the provided lab orders  to [facility] in [place] or Dr. Unk Lightning to have this drawn. If you have labs (blood work) drawn today and your tests are completely normal, you will receive your results only by: Marland Kitchen MyChart Message (if you have MyChart) OR . A paper copy in the mail If you have any lab test that is abnormal or we need to change your treatment, we will call you to review the results.  Follow-Up: At [facility], you and your health needs are our priority.  As part of our continuing mission to provide you with exceptional heart care, we have created designated Provider Care Teams.  These Care Teams include your primary Cardiologist (physician) and Advanced Practice Providers (APPs -  Physician Assistants and Nurse Practitioners) who all work together to provide you with the care you need, when you need it. Your next appointment:   12 month(s) The format for your next appointment:   In Person Provider:   You may see Sherren Mocha, MD or one of the following Advanced Practice Providers on your designated Care Team:    Richardson Dopp, PA-C  Robbie Lis, [place]      Signed, Sherren Mocha, MD  12/02/2019 2:57 PM    [place]

## 2019-12-02 NOTE — Patient Instructions (Addendum)
Medication Instructions:  1) START CRESTOR (rosuvastatin) 10 mg daily *If you need a refill on your cardiac medications before your next appointment, please call your pharmacy*  Lab Work: Your provider recommends that you have FASTING lab work in 3 months. Please take the provided lab orders to [facility] in [place] or Dr. Unk Lightning to have this drawn. If you have labs (blood work) drawn today and your tests are completely normal, you will receive your results only by:  [place] (if you have MyChart) OR  A paper copy in the mail If you have any lab test that is abnormal or we need to change your treatment, we will call you to review the results.  Follow-Up: At [facility], you and your health needs are our priority.  As part of our continuing mission to provide you with exceptional heart care, we have created designated Provider Care Teams.  These Care Teams include your primary Cardiologist (physician) and Advanced Practice Providers (APPs -  Physician Assistants and Nurse Practitioners) who all work together to provide you with the care you need, when you need it. Your next appointment:   12 month(s) The format for your next appointment:   In Person Provider:   You may see Sherren Mocha, MD or one of the following Advanced Practice Providers on your designated Care Team:    Richardson Dopp, PA-C  [place], [place]

## 2019-12-07 DIAGNOSIS — M25532 Pain in left wrist: Secondary | ICD-10-CM | POA: Diagnosis not present

## 2019-12-07 DIAGNOSIS — M170 Bilateral primary osteoarthritis of knee: Secondary | ICD-10-CM | POA: Diagnosis not present

## 2019-12-07 DIAGNOSIS — J3089 Other allergic rhinitis: Secondary | ICD-10-CM | POA: Diagnosis not present

## 2019-12-07 DIAGNOSIS — G8929 Other chronic pain: Secondary | ICD-10-CM | POA: Diagnosis not present

## 2020-01-05 DIAGNOSIS — N138 Other obstructive and reflux uropathy: Secondary | ICD-10-CM | POA: Diagnosis not present

## 2020-01-05 DIAGNOSIS — M79645 Pain in left finger(s): Secondary | ICD-10-CM | POA: Diagnosis not present

## 2020-01-05 DIAGNOSIS — Z1211 Encounter for screening for malignant neoplasm of colon: Secondary | ICD-10-CM | POA: Diagnosis not present

## 2020-01-05 DIAGNOSIS — F3342 Major depressive disorder, recurrent, in full remission: Secondary | ICD-10-CM | POA: Diagnosis not present

## 2020-01-05 DIAGNOSIS — G8929 Other chronic pain: Secondary | ICD-10-CM | POA: Diagnosis not present

## 2020-01-05 DIAGNOSIS — M79642 Pain in left hand: Secondary | ICD-10-CM | POA: Diagnosis not present

## 2020-01-05 DIAGNOSIS — K219 Gastro-esophageal reflux disease without esophagitis: Secondary | ICD-10-CM | POA: Diagnosis not present

## 2020-01-05 DIAGNOSIS — M19042 Primary osteoarthritis, left hand: Secondary | ICD-10-CM | POA: Diagnosis not present

## 2020-01-05 DIAGNOSIS — M545 Low back pain, unspecified: Secondary | ICD-10-CM | POA: Diagnosis not present

## 2020-01-05 DIAGNOSIS — Z23 Encounter for immunization: Secondary | ICD-10-CM | POA: Diagnosis not present

## 2020-01-05 DIAGNOSIS — E782 Mixed hyperlipidemia: Secondary | ICD-10-CM | POA: Diagnosis not present

## 2020-01-05 DIAGNOSIS — N401 Enlarged prostate with lower urinary tract symptoms: Secondary | ICD-10-CM | POA: Diagnosis not present

## 2020-01-05 DIAGNOSIS — Z125 Encounter for screening for malignant neoplasm of prostate: Secondary | ICD-10-CM | POA: Diagnosis not present

## 2020-01-05 DIAGNOSIS — Z0000 Encounter for general adult medical examination without abnormal findings: Secondary | ICD-10-CM | POA: Diagnosis not present

## 2020-03-21 DIAGNOSIS — R0981 Nasal congestion: Secondary | ICD-10-CM | POA: Diagnosis not present

## 2020-03-31 ENCOUNTER — Emergency Department (HOSPITAL_COMMUNITY): Payer: PPO

## 2020-03-31 ENCOUNTER — Other Ambulatory Visit: Payer: Self-pay

## 2020-03-31 ENCOUNTER — Encounter (HOSPITAL_COMMUNITY): Payer: Self-pay | Admitting: *Deleted

## 2020-03-31 ENCOUNTER — Emergency Department (HOSPITAL_COMMUNITY)
Admission: EM | Admit: 2020-03-31 | Discharge: 2020-03-31 | Disposition: A | Discharge status: Home / Self Care | Payer: PPO | Attending: Emergency Medicine | Admitting: Emergency Medicine

## 2020-03-31 DIAGNOSIS — N390 Urinary tract infection, site not specified: Secondary | ICD-10-CM | POA: Diagnosis not present

## 2020-03-31 DIAGNOSIS — R079 Chest pain, unspecified: Secondary | ICD-10-CM

## 2020-03-31 DIAGNOSIS — R0789 Other chest pain: Secondary | ICD-10-CM | POA: Insufficient documentation

## 2020-03-31 LAB — CBC
HCT: 44.4 % (ref 39.0–52.0)
Hemoglobin: 14.1 g/dL (ref 13.0–17.0)
MCH: 29.6 pg (ref 26.0–34.0)
MCHC: 31.8 g/dL (ref 30.0–36.0)
MCV: 93.1 fL (ref 80.0–100.0)
Platelets: 273 10*3/uL (ref 150–400)
RBC: 4.77 MIL/uL (ref 4.22–5.81)
RDW: 11.3 % — ABNORMAL LOW (ref 11.5–15.5)
WBC: 14.7 10*3/uL — ABNORMAL HIGH (ref 4.0–10.5)
nRBC: 0 % (ref 0.0–0.2)

## 2020-03-31 LAB — TROPONIN I (HIGH SENSITIVITY)
Troponin I (High Sensitivity): 5 ng/L (ref <18)
Troponin I (High Sensitivity): 6 ng/L (ref <18)

## 2020-03-31 LAB — BASIC METABOLIC PANEL
Anion gap: 10 (ref 5–15)
BUN: 21 mg/dL (ref 8–23)
CO2: 27 mmol/L (ref 22–32)
Calcium: 9.3 mg/dL (ref 8.9–10.3)
Chloride: 100 mmol/L (ref 98–111)
Creatinine, Ser: 0.85 mg/dL (ref 0.61–1.24)
GFR, Estimated: >60 mL/min (ref >60)
Glucose, Bld: 150 mg/dL — ABNORMAL HIGH (ref 70–99)
Potassium: 3.8 mmol/L (ref 3.5–5.1)
Sodium: 137 mmol/L (ref 135–145)

## 2020-03-31 MED ORDER — IBUPROFEN 600 MG PO TABS: 600.0000 mg | ORAL_TABLET | Freq: Three times a day (TID) | ORAL | 0 refills | Status: AC

## 2020-03-31 MED ORDER — NITROGLYCERIN 0.4 MG SL SUBL
0.4000 mg | SUBLINGUAL_TABLET | SUBLINGUAL | Status: DC | PRN
  Administered 2020-03-31: 0.4 mg via SUBLINGUAL
  Filled 2020-03-31: qty 1

## 2020-03-31 NOTE — Discharge Instructions (Addendum)
You have been seen and discharged from the emergency department.  Take ibuprofen daily as prescribed.  Be sure to eat or drink so that it does not upset your stomach.  Follow-up with your primary provider and cardiologist for further cardiac studying and reevaluation. Take home medications as prescribed. If you have any worsening symptoms or further concerns for health please return to an emergency department for further evaluation.

## 2020-03-31 NOTE — ED Triage Notes (Signed)
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  He took excedrin and had some slight relief.  Pt has pain in back and chest and states that it feels similar to when he had pericarditis in the past.  Pt recently had URI and completed a zpack yesterday.  Pt has some sob with this.

## 2020-03-31 NOTE — ED Provider Notes (Signed)
[facility] EMERGENCY DEPARTMENT Provider Note   CSN: [id number] Arrival date & time: 03/31/20  1912     History Chief Complaint  Patient presents with  . Chest Pain    Randy Ramos is a 75 y.o. male.  HPI   75 year old male with past medical history of pericarditis, GERD, HLD presents the emergency department with left-sided chest discomfort.  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  This translated to mainly left-sided chest heaviness.  Patient states it is worse when he lays flat.  He took a dose of aspirin at home which slightly improved the discomfort.  He had no associated shortness of breath, cough.  Patient states he has had similar symptoms in the past when he was diagnosed with pericarditis.  Patient admits to having a recent URI treated with steroids and most recently a Z-Pak.  Those symptoms have improved.  Patient denies any fever.  He is vaccinated and boosted for Covid, recently tested negative on home test.  No swelling of his lower extremities.  Currently he is complaining of slight left-sided chest heaviness.  Past Medical History:  Diagnosis Date  . ANGINA, STABLE 01/11/2010   Qualifier: Diagnosis of  By: Owens Shark RN, BSN, Lauren      Patient Active Problem List   Diagnosis Date Noted  . ANGINA, STABLE 01/11/2010    History reviewed. No pertinent surgical history.     Family History  Problem Relation Age of Onset  . Hypertension Mother   . Hypertension Father     Social History   Tobacco Use  . Smoking status: Never Smoker  . Smokeless tobacco: Former Network engineer Use Topics  . Alcohol use: No    Home Medications Prior to Admission medications   Medication Sig Start Date End Date Taking? Authorizing Provider  chlorhexidine (PERIDEX) 0.12 % solution Swish 15 MILLILITERS in the mouth or throat 2 times daily. 07/10/19   [provider]  Coenzyme Q10-Vitamin E (QUNOL ULTRA COQ10 PO) Take 1 capsule by mouth daily.    [provider]  ezetimibe (ZETIA) 10 MG tablet Take 10 mg by mouth daily. 12/21/16   [provider]  finasteride (PROSCAR) 5 MG tablet Take 5 mg by mouth daily. 10/27/16   [provider]  Omega-3 Fatty Acids (FISH OIL PO) Take 1 capsule by mouth daily.    [provider]  pantoprazole (PROTONIX) 40 MG tablet Take 40 mg by mouth daily. 10/29/16   [provider]  pramipexole (MIRAPEX) 0.125 MG tablet Take 1-4 tablets by mouth at bedtime. 10/29/16   [provider]  rosuvastatin (CRESTOR) 10 MG tablet Take 1 tablet (10 mg total) by mouth daily. 12/02/19 11/26/20  Sherren Mocha, MD  sertraline (ZOLOFT) 50 MG tablet Take 1 tablet by mouth daily. 11/18/17   [provider]  tamsulosin (FLOMAX) 0.4 MG CAPS capsule Take 0.8 mg by mouth at bedtime. 10/27/16   [provider]  traMADol Veatrice Bourbon) 50 MG tablet Take by mouth. 12/16/18   [provider]  traZODone (DESYREL) 50 MG tablet Take 50 mg by mouth at bedtime as needed for sleep.    [provider]    Allergies    Patient has no known allergies.  Review of Systems   Review of Systems  Constitutional: Positive for chills and fatigue. Negative for fever.  HENT: Positive for congestion.   Eyes:  Negative for visual disturbance.  Respiratory: Positive for chest tightness. Negative for shortness of breath.   Cardiovascular: Positive for chest pain.  Gastrointestinal: Negative for abdominal pain, diarrhea and vomiting.  Genitourinary: Negative for dysuria.  Skin: Negative for rash.  Neurological: Negative for headaches.    Physical Exam Updated Vital Signs BP (!) 177/63   Pulse 87   Temp 99.1 F (37.3 C) (Oral)   Resp 16   Wt 85.7 kg   SpO2 98%   BMI 27.91 kg/m   Physical Exam Vitals and nursing note reviewed.  Constitutional:      Appearance: Normal  appearance.  HENT:     Head: Normocephalic.     Mouth/Throat:     Mouth: Mucous membranes are moist.  Cardiovascular:     Rate and Rhythm: Normal rate.  Pulmonary:     Effort: Pulmonary effort is normal. No tachypnea or respiratory distress.     Breath sounds: No decreased breath sounds.  Chest:     Chest wall: No tenderness or crepitus.  Abdominal:     Palpations: Abdomen is soft.     Tenderness: There is no abdominal tenderness.  Skin:    General: Skin is warm.  Neurological:     Mental Status: He is alert and oriented to person, place, and time. Mental status is at baseline.  Psychiatric:        Mood and Affect: Mood normal.     ED Results / Procedures / Treatments   Labs (all labs ordered are listed, but only abnormal results are displayed) Labs Reviewed  BASIC METABOLIC PANEL - Abnormal; Notable for the following components:      Result Value   Glucose, Bld 150 (*)    All other components within normal limits  CBC - Abnormal; Notable for the following components:   WBC 14.7 (*)    RDW 11.3 (*)    All other components within normal limits  TROPONIN I (HIGH SENSITIVITY)  TROPONIN I (HIGH SENSITIVITY)    EKG EKG Interpretation  Date/Time:  Thursday March 31 2020 20:27:28 EST Ventricular Rate:  83 PR Interval:  200 QRS Duration: 80 QT Interval:  344 QTC Calculation: 405 R Axis:   64 Text Interpretation: Sinus rhythm Probable anteroseptal infarct, old NSR,  no STEMI Confirmed by Lavenia Atlas [phone]) on 03/31/2020 8:43:34 PM   Radiology DG Chest 2 View  Result Date: 03/31/2020 CLINICAL DATA:  Chest pain, urinary tract infection EXAM: CHEST - 2 VIEW COMPARISON:  02/03/2010 FINDINGS: The heart size and mediastinal contours are within normal limits. Both lungs are clear. The visualized skeletal structures are unremarkable. IMPRESSION: No active cardiopulmonary disease. Electronically Signed   By: Randa Ngo M.D.   On: 03/31/2020 19:54     Procedures Procedures   Medications Ordered in ED Medications - No data to display  ED Course  I have reviewed the triage vital signs and the nursing notes.  Pertinent labs & imaging results that were available during my care of the patient were reviewed by me and considered in my medical decision making (see chart for details).    MDM Rules/Calculators/A&P                          75 year old male presents the emergency department with left-sided chest discomfort.  EKG shows normal sinus rhythm, no acute ischemic changes.  The discomfort is worse when lying flat, better when laying forward.  No associated shortness of breath/respiratory distress.  Chest x-ray shows no acute finding.  Patient has a mild leukocytosis of 14.7 on the blood work, did recently finished a course of steroids, is having mild URI symptoms.  Chemistry is unremarkable, troponin is negative with no significant delta.  On reevaluation the discomfort is improved.  Given his age and risk factors cardiology was consulted.  Spoke with Delfina Redwood.  There is the possibility that this is pericarditis, we recommend treatment with ibuprofen and outpatient follow-up with his cardiologist for echo if needed.  But at this time chest pain is atypical, there is no indication for ACS, PE/dissection or severe pericarditis warranting further treatment or admission.  Patient is sitting up, well-appearing, offers no complaints.  Patient will be discharged and treated as an outpatient.  Discharge plan and strict return to ED precautions discussed, patient verbalizes understanding and agreement.  Final Clinical Impression(s) / ED Diagnoses Final diagnoses:  None    Rx / DC Orders ED Discharge Orders    None       Lorelle Gibbs, DO 04/01/20 0006

## 2020-04-01 ENCOUNTER — Telehealth: Payer: Self-pay | Admitting: Cardiovascular Disease

## 2020-04-01 DIAGNOSIS — R079 Chest pain, unspecified: Secondary | ICD-10-CM

## 2020-04-01 NOTE — Telephone Encounter (Signed)
    Pt's daughter calling, she said pt was in the hospital recently. She is requesting for Randy Ramos to call her back to discuss about the pt

## 2020-04-01 NOTE — Telephone Encounter (Signed)
Spoke with the patient's daughter (DPR) who reports Mr. Lindfors went to the ER with CP last night and was told to follow-up with Dr. Burt Knack with an echo for concern of pericarditis after recent viral infection (negative for Covid).   Per ED note: "MDM Rules/Calculators/A&P                          75 year old male presents the emergency department with left-sided chest discomfort.  EKG shows normal sinus rhythm, no acute ischemic changes.  The discomfort is worse when lying flat, better when laying forward.  No associated shortness of breath/respiratory distress.  Chest x-ray shows no acute finding.  Patient has a mild leukocytosis of 14.7 on the blood work, did recently finished a course of steroids, is having mild URI symptoms.  Chemistry is unremarkable, troponin is negative with no significant delta.  On reevaluation the discomfort is improved.  Given his age and risk factors cardiology was consulted.  Spoke with Delfina Redwood.  There is the possibility that this is pericarditis, we recommend treatment with ibuprofen and outpatient follow-up with his cardiologist for echo if needed.  But at this time chest pain is atypical, there is no indication for ACS, PE/dissection or severe pericarditis warranting further treatment or admission.  Patient is sitting up, well-appearing, offers no complaints.  Patient will be discharged and treated as an outpatient.  Discharge plan and strict return to ED precautions discussed, patient verbalizes understanding and agreement."  The patient was instructed to take ibuprofen 600 mg TID for 7 days.   Will hold echo appointment 3/1. DPR understands Dr. Burt Knack will be consulted and she will be called with his recommendations.

## 2020-04-04 NOTE — Telephone Encounter (Signed)
Per Dr. Burt Knack, [place] to order echo for CP.  Linked to appointment scheduled 3/1.

## 2020-04-05 ENCOUNTER — Other Ambulatory Visit: Payer: Self-pay

## 2020-04-05 ENCOUNTER — Ambulatory Visit (HOSPITAL_COMMUNITY): Payer: PPO | Attending: Cardiovascular Disease

## 2020-04-05 DIAGNOSIS — R079 Chest pain, unspecified: Secondary | ICD-10-CM | POA: Diagnosis not present

## 2020-04-05 LAB — ECHOCARDIOGRAM COMPLETE
Area-P 1/2: 3.65 cm2
S' Lateral: 2.9 cm

## 2020-04-12 ENCOUNTER — Encounter: Payer: Self-pay | Admitting: Cardiovascular Disease

## 2020-04-12 ENCOUNTER — Other Ambulatory Visit: Payer: Self-pay

## 2020-04-12 ENCOUNTER — Ambulatory Visit: Payer: PPO | Admitting: Cardiovascular Disease

## 2020-04-12 VITALS — BP 124/78 | HR 77 | Ht 69.0 in | Wt 198.2 lb

## 2020-04-12 DIAGNOSIS — R079 Chest pain, unspecified: Secondary | ICD-10-CM | POA: Diagnosis not present

## 2020-04-12 DIAGNOSIS — E782 Mixed hyperlipidemia: Secondary | ICD-10-CM

## 2020-04-12 NOTE — Progress Notes (Signed)
Cardiology Office Note:    Date:  04/12/2020   ID:  Randy Ramos, DOB 03-Mar-1945, MRN [id number]  PCP:  Myrlene Broker, MD   [facility]  Cardiologist:  Sherren Mocha, MD  Advanced Practice Provider:  No care team member to display Electrophysiologist:  None       Referring MD: Myrlene Broker, MD   Chief Complaint  Patient presents with   Chest Pain    History of Present Illness:    Randy Ramos is a 75 y.o. male with a hx of nonobstructive coronary artery disease and mixed hyperlipidemia, presenting for follow-up evaluation.  The patient underwent cardiac catheterization in 2011 when he presented with typical symptoms of exertional angina.  His cardiac catheterization demonstrated patent coronary arteries with minimal nonobstructive CAD.  He has had some problems with statin intolerance in the past, but when he was seen last year, he was noted to have an LDL cholesterol 140 mg/dL.  He agreed to a trial of rosuvastatin 10 mg daily.  He was evaluated in the emergency department 03/31/2020 with chest pain.  Objective findings were negative.  His chest pain was felt to be atypical.  He was given nonsteroidal anti-inflammatories for as needed use.  An echocardiogram done April 05, 2020 shows normal LV function, no valvular disease, no evidence of pericardial effusion.  The patient had a dull chest pain on the left side of his chest, prompting his ER visit.  He was treated with ibuprofen and his symptoms are markedly improved by the following day.  He has had no recurrent chest pain or pressure.  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  He denies shortness of breath, recurrent chest pain, edema, orthopnea, PND, or heart palpitations.  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  Past Medical History:  Diagnosis Date   ANGINA, STABLE 01/11/2010    Qualifier: Diagnosis of  By: Owens Shark, RN, BSN, Lauren      History reviewed. No pertinent surgical history.  Current Medications: Current Meds  Medication Sig   chlorhexidine (PERIDEX) 0.12 % solution Swish 15 MILLILITERS in the mouth or throat 2 times daily.   Coenzyme Q10-Vitamin E (QUNOL ULTRA COQ10 PO) Take 1 capsule by mouth daily.   ezetimibe (ZETIA) 10 MG tablet Take 10 mg by mouth daily.   finasteride (PROSCAR) 5 MG tablet Take 5 mg by mouth daily.   pantoprazole (PROTONIX) 40 MG tablet Take 40 mg by mouth daily.   pramipexole (MIRAPEX) 0.125 MG tablet Take 1-4 tablets by mouth at bedtime.   sertraline (ZOLOFT) 50 MG tablet Take 1 tablet by mouth daily.   tamsulosin (FLOMAX) 0.4 MG CAPS capsule Take 0.8 mg by mouth at bedtime.   traMADol (ULTRAM) 50 MG tablet Take by mouth.     Allergies:   Patient has no known allergies.   Social History   Socioeconomic History   Marital status: Married    Spouse name: Not on file   Number of children: Not on file   Years of education: Not on file   Highest education level: Not on file  Occupational History   Not on file  Tobacco Use   Smoking status: Never Smoker   Smokeless tobacco: Former Armed forces training and education officer Use   Vaping Use: Never used  Substance and Sexual Activity   Alcohol  use: No   Drug use: Never   Sexual activity: Not on file  Other Topics Concern   Not on file  Social History Narrative   Not on file   Social Determinants of Health   Financial Resource Strain: Not on file  Food Insecurity: Not on file  Transportation Needs: Not on file  Physical Activity: Not on file  Stress: Not on file  Social Connections: Not on file     Family History: The patient's family history includes Hypertension in his father and mother.  ROS:   Please see the history of present illness.    All other systems reviewed and are negative.  EKGs/Labs/Other Studies Reviewed:    The following studies were reviewed  today: Echo 04/05/2020: IMPRESSIONS    1. Left ventricular ejection fraction, by estimation, is 60 to 65%. Left  ventricular ejection fraction by PLAX is 59 %. The left ventricle has  normal function. The left ventricle has no regional wall motion  abnormalities. There is mild concentric left  ventricular hypertrophy. Left ventricular diastolic parameters are  consistent with Grade I diastolic dysfunction (impaired relaxation).  Elevated left ventricular end-diastolic pressure. The average left  ventricular global longitudinal strain is -20.5 %.  The global longitudinal strain is normal.  2. Right ventricular systolic function is normal. The right ventricular  size is normal. There is normal pulmonary artery systolic pressure.  3. The mitral valve is normal in structure. No evidence of mitral valve  regurgitation. No evidence of mitral stenosis.  4. The aortic valve is tricuspid. Aortic valve regurgitation is not  visualized. No aortic stenosis is present.  5. The inferior vena cava is normal in size with greater than 50%  respiratory variability, suggesting right atrial pressure of 3 mmHg.   EKG:  EKG is not ordered today.  Recent EKG from the ER reviewed.   Recent Labs: 03/31/2020: BUN 21; Creatinine, Ser 0.85; Hemoglobin 14.1; Platelets 273; Potassium 3.8; Sodium 137  Recent Lipid Panel No results found for: CHOL, TRIG, HDL, CHOLHDL, VLDL, LDLCALC, LDLDIRECT   Risk Assessment/Calculations:      Physical Exam:    VS:  BP 124/78    Pulse 77    Ht 5\' 9"  (1.753 m)    Wt 198 lb 3.2 oz (89.9 kg)    SpO2 96%    BMI 29.27 kg/m     Wt Readings from Last 3 Encounters:  04/12/20 198 lb 3.2 oz (89.9 kg)  03/31/20 189 lb (85.7 kg)  12/02/19 189 lb (85.7 kg)     GEN: Well nourished, well developed in no acute distress HEENT: Normal NECK: No JVD; No carotid bruits LYMPHATICS: No lymphadenopathy CARDIAC: RRR, 2/6 systolic ejection murmur at the right upper sternal border, no  friction rub RESPIRATORY:  Clear to auscultation without rales, wheezing or rhonchi  ABDOMEN: Soft, non-tender, non-distended MUSCULOSKELETAL:  No edema; No deformity  SKIN: Warm and dry NEUROLOGIC:  Alert and oriented x 3 PSYCHIATRIC:  Normal affect   ASSESSMENT:    1. Chest pain of uncertain etiology   2. Mixed hyperlipidemia    PLAN:    In order of problems listed above:  1. Suspect post viral pericarditis.  I reviewed his EKGs which have some very subtle changes that could be consistent with pericarditis.  He mild diffuse submillimeter ST segment elevation.  He has had pericarditis in the past and this felt similar to him.  Fortunately his troponin values were normal and his echo shows no abnormalities.  His symptoms have now completely resolved after completing a course of ibuprofen.  He has no residual chest discomfort.  He will call if problems recur.  If he does have recurrent symptoms, I would likely give him a 57-month course of colchicine. 2. Treated with Zetia.  Has been intolerant to statin drugs.  Medication Adjustments/Labs and Tests Ordered: Current medicines are reviewed at length with the patient today.  Concerns regarding medicines are outlined above.  No orders of the defined types were placed in this encounter.  No orders of the defined types were placed in this encounter.   Patient Instructions  Medication Instructions:  Your provider recommends that you continue on your current medications as directed. Please refer to the Current Medication list given to you today.   *If you need a refill on your cardiac medications before your next appointment, please call your pharmacy*  Follow-Up: At [facility], you and your health needs are our priority.  As part of our continuing mission to provide you with exceptional heart care, we have created designated Provider Care Teams.  These Care Teams include your primary Cardiologist (physician) and Advanced Practice  Providers (APPs -  Physician Assistants and Nurse Practitioners) who all work together to provide you with the care you need, when you need it. Your next appointment:   12 month(s) The format for your next appointment:   In Person Provider:   You may see Sherren Mocha, MD or one of the following Advanced Practice Providers on your designated Care Team:    Richardson Dopp, PA-C  Robbie Lis, [place]      Signed, Sherren Mocha, MD  04/12/2020 3:03 PM    [facility]

## 2020-04-12 NOTE — Patient Instructions (Signed)

## 2020-06-03 DIAGNOSIS — B351 Tinea unguium: Secondary | ICD-10-CM | POA: Diagnosis not present

## 2020-06-03 DIAGNOSIS — L300 Nummular dermatitis: Secondary | ICD-10-CM | POA: Diagnosis not present

## 2020-06-03 DIAGNOSIS — L570 Actinic keratosis: Secondary | ICD-10-CM | POA: Diagnosis not present

## 2020-06-03 DIAGNOSIS — L299 Pruritus, unspecified: Secondary | ICD-10-CM | POA: Diagnosis not present

## 2020-06-03 DIAGNOSIS — D0439 Carcinoma in situ of skin of other parts of face: Secondary | ICD-10-CM | POA: Diagnosis not present

## 2020-10-03 DIAGNOSIS — R972 Elevated prostate specific antigen [PSA]: Secondary | ICD-10-CM | POA: Diagnosis not present

## 2020-10-03 DIAGNOSIS — R351 Nocturia: Secondary | ICD-10-CM | POA: Diagnosis not present

## 2020-10-03 DIAGNOSIS — N401 Enlarged prostate with lower urinary tract symptoms: Secondary | ICD-10-CM | POA: Diagnosis not present

## 2020-10-26 DIAGNOSIS — I10 Essential (primary) hypertension: Secondary | ICD-10-CM | POA: Diagnosis not present

## 2020-10-26 DIAGNOSIS — Z23 Encounter for immunization: Secondary | ICD-10-CM | POA: Diagnosis not present

## 2021-01-06 DIAGNOSIS — E782 Mixed hyperlipidemia: Secondary | ICD-10-CM | POA: Diagnosis not present

## 2021-01-06 DIAGNOSIS — R7309 Other abnormal glucose: Secondary | ICD-10-CM | POA: Diagnosis not present

## 2021-01-06 DIAGNOSIS — K219 Gastro-esophageal reflux disease without esophagitis: Secondary | ICD-10-CM | POA: Diagnosis not present

## 2021-01-06 DIAGNOSIS — Z0000 Encounter for general adult medical examination without abnormal findings: Secondary | ICD-10-CM | POA: Diagnosis not present

## 2021-01-06 DIAGNOSIS — Z125 Encounter for screening for malignant neoplasm of prostate: Secondary | ICD-10-CM | POA: Diagnosis not present

## 2021-01-06 DIAGNOSIS — N400 Enlarged prostate without lower urinary tract symptoms: Secondary | ICD-10-CM | POA: Diagnosis not present

## 2021-01-06 DIAGNOSIS — F3342 Major depressive disorder, recurrent, in full remission: Secondary | ICD-10-CM | POA: Diagnosis not present

## 2021-01-06 DIAGNOSIS — M170 Bilateral primary osteoarthritis of knee: Secondary | ICD-10-CM | POA: Diagnosis not present

## 2021-01-06 DIAGNOSIS — Z79899 Other long term (current) drug therapy: Secondary | ICD-10-CM | POA: Diagnosis not present

## 2021-01-06 DIAGNOSIS — I10 Essential (primary) hypertension: Secondary | ICD-10-CM | POA: Diagnosis not present

## 2021-01-23 DIAGNOSIS — U071 COVID-19: Secondary | ICD-10-CM | POA: Diagnosis not present

## 2021-04-20 ENCOUNTER — Ambulatory Visit (HOSPITAL_BASED_OUTPATIENT_CLINIC_OR_DEPARTMENT_OTHER): Payer: PPO | Admitting: Family

## 2021-05-08 NOTE — Progress Notes (Signed)
? ? ?Office Visit  ?  ?Patient Name: Randy Ramos ?Date of Encounter: 05/10/2021 ? ?Primary Care Provider:  Myrlene Broker, MD ?Primary Cardiologist:  Sherren Mocha, MD ?Primary Electrophysiologist: None ?Chief Complaint  ?  ?1 year follow-up for nonobstructive CAD ? ? Patient Profile: ?HLD ?Nonobstructive CAD ?Pericarditis ?HTN ?BPH ? Recent Studies: ?01/2010 LHC: Minimal nonobstructive CAD, normal LV function ?02/2017 SPECT ETT: No ischemia or EKG changes during exercise. ?04/2020 TTE: EF 60-65%, normal LV function with mild concentric LVH, grade 1 DD, normal valve structure ? ?History of Present Illness  ?  ?Randy Ramos is a 76 y.o. male with PMH of HTN, HLD, pericarditis, nonobstructive CAD s/p LHC 01/2010 with findings of minimal nonobstructive CAD.  He was last seen by Dr. Burt Knack on 04/2020 following a brief bout with pericarditis. Patient has experienced some statin intolerance in the past, currently he is tolerating Crestor 10 mg, and Zetia 10 mg. ? ?Since last being seen in our clinic Randy Ramos reports doing well.  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 He is tolerating lisinopril 5 mg daily without any side effects.  He has no complaints of statin intolerances and is currently taking Zetia 10 mg only.  Blood pressure today was 146/80, he endorses numbers at home of 120s to 130's. He admits to indiscretions with sodium and was advised to abstain from salting food. He had complaints of increased restless leg symptoms, and I advised to follow-up with PCP regarding neck steps in prevention. He denies chest pain, palpitations, dyspnea, PND, orthopnea, nausea, vomiting, dizziness, syncope, edema, weight gain, or early satiety. ? ?Past Medical History  ?  ?Past Medical History:  ?Diagnosis Date  ? ANGINA, STABLE 01/11/2010  ? A.  01/2010 LHC with minimal nonobstructive CAD b.qualifier: Diagnosis of  By: Owens Shark, RN, BSN, Lauren  ? BPH (benign  prostatic hyperplasia)   ? Essential (primary) hypertension   ? Mixed hyperlipidemia   ? Has expereinced some intolerance to statins in the past  ? Pericarditis   ? ?No past surgical history on file. ? ?Allergies ? ?No Known Allergies ? ?Home Medications  ?  ?Current Outpatient Medications  ?Medication Sig Dispense Refill  ? chlorhexidine (PERIDEX) 0.12 % solution Swish 15 MILLILITERS in the mouth or throat 2 times daily.    ? Coenzyme Q10-Vitamin E (QUNOL ULTRA COQ10 PO) Take 1 capsule by mouth daily.    ? ezetimibe (ZETIA) 10 MG tablet Take 10 mg by mouth daily.  2  ? finasteride (PROSCAR) 5 MG tablet Take 5 mg by mouth daily.  3  ? pantoprazole (PROTONIX) 40 MG tablet Take 40 mg by mouth daily.    ? pramipexole (MIRAPEX) 0.125 MG tablet Take 1-4 tablets by mouth at bedtime.  0  ? rosuvastatin (CRESTOR) 10 MG tablet Take 1 tablet (10 mg total) by mouth daily. (Patient not taking: Reported on 04/12/2020) 90 tablet 3  ? sertraline (ZOLOFT) 50 MG tablet Take 1 tablet by mouth daily.    ? tamsulosin (FLOMAX) 0.4 MG CAPS capsule Take 0.8 mg by mouth at bedtime.  3  ? traMADol (ULTRAM) 50 MG tablet Take by mouth.    ? ?No current facility-administered medications for this visit.  ?  ? ?Review of Systems  ?Please see the history of present illness.    ?(+) Dyspnea on exertion ?(+) Increased fatigue ? ?All other systems reviewed and are otherwise negative except as noted above. ? ?  Physical Exam  ?  ?Wt Readings from Last 3 Encounters:  ?04/12/20 198 lb 3.2 oz (89.9 kg)  ?03/31/20 189 lb (85.7 kg)  ?12/02/19 189 lb (85.7 kg)  ? ?QJ:FHLKT were no vitals filed for this visit.,There is no height or weight on file to calculate BMI. ? ?Constitutional:   ?   Appearance: Healthy appearance. Not in distress.  ?Neck:  ?   Vascular: JVD normal.  ?Pulmonary:  ?   Effort: Pulmonary effort is normal.  ?   Breath sounds: No wheezing. No rales.  ?Cardiovascular:  ?   Normal rate. Regular rhythm. Normal S1. Normal S2.   ?   Murmurs: There  is no murmur.  ?Edema: ?   Peripheral edema absent.  ?Abdominal:  ?   Palpations: Abdomen is soft. There is no hepatomegaly.  ?Skin: ?   General: Skin is warm and dry.  ?Neurological:  ?   General: No focal deficit present.  ?   Mental Status: Alert and oriented to person, place and time.  ?   Cranial Nerves: Cranial nerves are intact.  ?EKG/LABS/Other Studies Reviewed  ?  ?ECG personally reviewed by me today -normal sinus rhythm  rate of 65- no acute changes. ? ?Lab Results  ?Component Value Date  ? WBC 14.7 (H) 03/31/2020  ? HGB 14.1 03/31/2020  ? HCT 44.4 03/31/2020  ? MCV 93.1 03/31/2020  ? PLT 273 03/31/2020  ? ?Lab Results  ?Component Value Date  ? CREATININE 0.85 03/31/2020  ? BUN 21 03/31/2020  ? NA 137 03/31/2020  ? K 3.8 03/31/2020  ? CL 100 03/31/2020  ? CO2 27 03/31/2020  ? ?No results found for: ALT, AST, GGT, ALKPHOS, BILITOT ?No results found for: CHOL, HDL, LDLCALC, LDLDIRECT, TRIG, CHOLHDL  ?No results found for: HGBA1C ? ?Assessment & Plan  ?  ?1.  Nonobstructive CAD: ?-s/p LHC 01/2010 with findings of minimal nonobstructive CAD and history of pericarditis. 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 ?-Cardiac CTA will be ordered to rule out possible ischemic cause for fatigue and shortness of breath. ?-We will order BMET today ? ?2.  Hyperlipidemia: ?-Last LDL cholesterol was 129 on 12/22 above goal of less than 70 ?-Continue Zetia 10 mg daily  ?-May consider referral to lipid clinic in the future ?-Encouraged to walk and increase heart rate as tolerated. ? ?3.  Essential hypertension: ?-Patient's blood pressure today was 146/80 ?-Continue lisinopril 5 mg daily ?-May explore additional increase in lisinopril if blood pressures remains elevated at follow-up ?-Advised to monitor blood pressures at least once or twice per week and report any systolic readings greater than 150 ?-Discussed importance of abstaining from excess sodium in diet. ? ?4.   Precordial pain: ?-Patient denies any complaints of precordial pain since last appointment. ?-He was advised to contact the office if he has complaint of precordial pain that extends into his arms or neck. ? ? ?Disposition: Follow-up with Sherren Mocha, MD or APP in 3 months    ?Medication Adjustments/Labs and Tests Ordered: ?Current medicines are reviewed at length with the patient today.  Concerns regarding medicines are outlined above.  ?Tests Ordered: ?No orders of the defined types were placed in this encounter. ? ?Medication Changes: ?No orders of the defined types were placed in this encounter. ? ? ?Signed, ?Mable Fill, Marissa Nestle, NP ?05/10/2021, 10:03 AM ?[place] ?

## 2021-05-10 ENCOUNTER — Ambulatory Visit (HOSPITAL_BASED_OUTPATIENT_CLINIC_OR_DEPARTMENT_OTHER): Payer: PPO | Admitting: Nurse Practitioner

## 2021-05-10 ENCOUNTER — Encounter (HOSPITAL_BASED_OUTPATIENT_CLINIC_OR_DEPARTMENT_OTHER): Payer: Self-pay | Admitting: Nurse Practitioner

## 2021-05-10 VITALS — BP 146/80 | HR 65 | Ht 69.0 in | Wt 217.6 lb

## 2021-05-10 DIAGNOSIS — I2510 Atherosclerotic heart disease of native coronary artery without angina pectoris: Secondary | ICD-10-CM | POA: Diagnosis not present

## 2021-05-10 DIAGNOSIS — R072 Precordial pain: Secondary | ICD-10-CM

## 2021-05-10 DIAGNOSIS — I10 Essential (primary) hypertension: Secondary | ICD-10-CM | POA: Diagnosis not present

## 2021-05-10 DIAGNOSIS — E782 Mixed hyperlipidemia: Secondary | ICD-10-CM

## 2021-05-10 LAB — BASIC METABOLIC PANEL
BUN/Creatinine Ratio: 18 (ref 10–24)
BUN: 17 mg/dL (ref 8–27)
CO2: 24 mmol/L (ref 20–29)
Calcium: 9.8 mg/dL (ref 8.6–10.2)
Chloride: 102 mmol/L (ref 96–106)
Creatinine, Ser: 0.97 mg/dL (ref 0.76–1.27)
Glucose: 105 mg/dL — ABNORMAL HIGH (ref 70–99)
Potassium: 4.9 mmol/L (ref 3.5–5.2)
Sodium: 141 mmol/L (ref 134–144)
eGFR: 81 mL/min/{1.73_m2} (ref >59)

## 2021-05-10 MED ORDER — METOPROLOL TARTRATE 50 MG PO TABS: ORAL_TABLET | ORAL | 0 refills | Status: DC

## 2021-05-10 NOTE — Patient Instructions (Addendum)
Medication Instructions:  ?Continue your current medications.  ? ?Take Metoprolol 2 hours prior to cardiac CTA. ? ?*If you need a refill on your cardiac medications before your next appointment, please call your pharmacy* ? ? ?Lab Work: ?Your physician recommends that you return for lab work today: BMP ? ?If you have labs (blood work) drawn today and your tests are completely normal, you will receive your results only by: ?MyChart Message (if you have MyChart) OR ?A paper copy in the mail ?If you have any lab test that is abnormal or we need to change your treatment, we will call you to review the results. ? ? ?Testing/Procedures: ?Your provider recommends a cardiac CTA. ? ?Follow-Up: ?At [facility], you and your health needs are our priority.  As part of our continuing mission to provide you with exceptional heart care, we have created designated Provider Care Teams.  These Care Teams include your primary Cardiologist (physician) and Advanced Practice Providers (APPs -  Physician Assistants and Nurse Practitioners) who all work together to provide you with the care you need, when you need it. ? ?We recommend signing up for the patient portal called "MyChart".  Sign up information is provided on this After Visit Summary.  MyChart is used to connect with patients for Virtual Visits (Telemedicine).  Patients are able to view lab/test results, encounter notes, upcoming appointments, etc.  Non-urgent messages can be sent to your provider as well.   ?To learn more about what you can do with MyChart, go to [email or web address].   ? ?Your next appointment:   ?As scheduled in July with Dr. Burt Knack ? ? ?Other Instructions ? ? ? ?Your cardiac CT will be scheduled at: ? ?[facility] ?[address] ?[place], [place] [address] ?[phone] ? ? ?If scheduled at [facility], please arrive at the [facility] and Children's Entrance (Entrance C2) of [facility] 30 minutes prior to test start  time. ?You can use the FREE valet parking offered at entrance C (encouraged to control the heart rate for the test)  ?Proceed to the [facility] Radiology Department (first floor) to check-in and test prep. ? ?All radiology patients and guests should use entrance C2 at [facility], accessed from [facility], even though the hospital's physical address listed is [address]. ? ? ? ? ?Please follow these instructions carefully (unless otherwise directed): ? ?Hold all erectile dysfunction medications at least 3 days (72 hrs) prior to test. ? ?On the Night Before the Test: ?Be sure to Drink plenty of water. ?Do not consume any caffeinated/decaffeinated beverages or chocolate 12 hours prior to your test. ?Do not take any antihistamines 12 hours prior to your test. ? ?On the Day of the Test: ?Drink plenty of water until 1 hour prior to the test. ?Do not eat any food 4 hours prior to the test. ?You may take your regular medications prior to the test.  ?Take metoprolol (Lopressor) two hours prior to test. ?     ?After the Test: ?Drink plenty of water. ?After receiving IV contrast, you may experience a mild flushed feeling. This is normal. ?On occasion, you may experience a mild rash up to 24 hours after the test. This is not dangerous. If this occurs, you can take Benadryl 25 mg and increase your fluid intake. ?If you experience trouble breathing, this can be serious. If it is severe call 911 IMMEDIATELY. If it is mild, please call our office. ?If you take  any of these medications: Glipizide/Metformin, Avandament, Glucavance, please do not take 48 hours after completing test unless otherwise instructed. ? ?We will call to schedule your test 2-4 weeks out understanding that some insurance companies will need an authorization prior to the service being performed.  ? ?For non-scheduling related questions, please contact the cardiac imaging nurse navigator should you have any  questions/concerns: ?Marchia Bond, Cardiac Imaging Nurse Navigator ?Gordy Clement, Cardiac Imaging Nurse Navigator ?[facility] ?Direct Office Dial: [phone]  ? ?For scheduling needs, including cancellations and rescheduling, please call [place], [phone].  ? ? ?Heart Healthy Diet Recommendations: ?A low-salt diet is recommended. Meats should be grilled, baked, or boiled. Avoid fried foods. Focus on lean protein sources like fish or chicken with vegetables and fruits. The American Heart Association is a Microbiologist!  American Heart Association Diet and Lifeystyle Recommendations   ? ?Exercise recommendations: ?The American Heart Association recommends 150 minutes of moderate intensity exercise weekly. ?Try 30 minutes of moderate intensity exercise 4-5 times per week. ?This could include walking, jogging, or swimming. ? ? ? ?

## 2021-05-11 ENCOUNTER — Telehealth (HOSPITAL_BASED_OUTPATIENT_CLINIC_OR_DEPARTMENT_OTHER): Payer: Self-pay

## 2021-05-11 NOTE — Telephone Encounter (Addendum)
Results called to patient who verbalizes understanding!  ? ? ? ? ?----- Message from Marylu Lund., NP sent at 05/11/2021  7:31 AM EDT ----- ?Stable kidney function. Normal electrolytes. Good result! Okay to proceed with Cardiac CTA ? ?Thanks, ? ?Ambrose Pancoast, NP ?

## 2021-05-24 ENCOUNTER — Telehealth (HOSPITAL_COMMUNITY): Payer: Self-pay | Admitting: *Deleted

## 2021-05-24 NOTE — Telephone Encounter (Signed)
Attempted to call patient regarding upcoming cardiac CT appointment. °Left message on voicemail with name and callback number ° °Zan Orlick RN Navigator Cardiac Imaging °[facility] °[phone] Office °[phone] Cell ° °

## 2021-05-25 ENCOUNTER — Encounter (HOSPITAL_COMMUNITY): Payer: Self-pay

## 2021-05-25 ENCOUNTER — Ambulatory Visit (HOSPITAL_COMMUNITY)
Admission: RE | Admit: 2021-05-25 | Discharge: 2021-05-25 | Disposition: A | Discharge status: Home / Self Care | Payer: PPO | Source: Ambulatory Visit | Attending: Nurse Practitioner | Admitting: Nurse Practitioner

## 2021-05-25 DIAGNOSIS — R072 Precordial pain: Secondary | ICD-10-CM | POA: Insufficient documentation

## 2021-05-25 MED ORDER — NITROGLYCERIN 0.4 MG SL SUBL
0.8000 mg | SUBLINGUAL_TABLET | Freq: Once | SUBLINGUAL | Status: AC
  Administered 2021-05-25: 0.8 mg via SUBLINGUAL

## 2021-05-25 MED ORDER — IOHEXOL 350 MG/ML SOLN
100.0000 mL | Freq: Once | INTRAVENOUS | Status: AC | PRN
  Administered 2021-05-25: 100 mL via INTRAVENOUS

## 2021-05-25 MED ORDER — NITROGLYCERIN 0.4 MG SL SUBL
SUBLINGUAL_TABLET | SUBLINGUAL | Status: AC
  Filled 2021-05-25: qty 2

## 2021-05-30 ENCOUNTER — Telehealth (HOSPITAL_BASED_OUTPATIENT_CLINIC_OR_DEPARTMENT_OTHER): Payer: Self-pay

## 2021-05-30 MED ORDER — ASPIRIN EC 81 MG PO TBEC: 81.0000 mg | DELAYED_RELEASE_TABLET | Freq: Every day | ORAL | 3 refills | Status: DC

## 2021-05-30 NOTE — Telephone Encounter (Addendum)
Results called to patient who verbalizes understanding!  ? ? ? ? ?----- Message from Marylu Lund., NP sent at 05/29/2021  4:55 PM EDT ----- ?Please let Mr. Gildersleeve know that his CTA revealed Mild non-obstructive CAD with no ischemic findings. Please continue your current regimen of medications and add an 81 mg ASA daily.Please let me now if you have any questions. ? ?Thanks, ? ?Ambrose Pancoast, NP ?

## 2021-08-09 ENCOUNTER — Encounter: Payer: Self-pay | Admitting: Cardiovascular Disease

## 2021-08-09 ENCOUNTER — Ambulatory Visit: Payer: PPO | Admitting: Cardiovascular Disease

## 2021-08-09 VITALS — BP 110/70 | HR 69 | Ht 69.0 in | Wt 197.0 lb

## 2021-08-09 DIAGNOSIS — I2510 Atherosclerotic heart disease of native coronary artery without angina pectoris: Secondary | ICD-10-CM

## 2021-08-09 DIAGNOSIS — E782 Mixed hyperlipidemia: Secondary | ICD-10-CM | POA: Diagnosis not present

## 2021-08-09 DIAGNOSIS — I10 Essential (primary) hypertension: Secondary | ICD-10-CM

## 2021-08-09 NOTE — Patient Instructions (Signed)
Medication Instructions:  Your physician recommends that you continue on your current medications as directed. Please refer to the Current Medication list given to you today.  *If you need a refill on your cardiac medications before your next appointment, please call your pharmacy*   Lab Work: NONE If you have labs (blood work) drawn today and your tests are completely normal, you will receive your results only by: MyChart Message (if you have MyChart) OR A paper copy in the mail If you have any lab test that is abnormal or we need to change your treatment, we will call you to review the results.   Testing/Procedures: NONE   Follow-Up: At [facility], you and your health needs are our priority.  As part of our continuing mission to provide you with exceptional heart care, we have created designated Provider Care Teams.  These Care Teams include your primary Cardiologist (physician) and Advanced Practice Providers (APPs -  Physician Assistants and Nurse Practitioners) who all work together to provide you with the care you need, when you need it.  We recommend signing up for the patient portal called "MyChart".  Sign up information is provided on this After Visit Summary.  MyChart is used to connect with patients for Virtual Visits (Telemedicine).  Patients are able to view lab/test results, encounter notes, upcoming appointments, etc.  Non-urgent messages can be sent to your provider as well.   To learn more about what you can do with MyChart, go to [email or web address].com.    Your next appointment:   1 year(s)  The format for your next appointment:   In Person  Provider:   Michael Cooper, MD      Important Information About Sugar       

## 2021-08-09 NOTE — Progress Notes (Signed)
Cardiology Office Note:    Date:  08/09/2021   ID:  Randy Ramos, DOB 1945/11/22, MRN [id number]  PCP:  Myrlene Broker, MD   [place] [facility] Cardiologist:  Sherren Mocha, MD     Referring MD: Myrlene Broker, MD   Chief Complaint  Patient presents with   Hypertension    History of Present Illness:    Randy Ramos is a 76 y.o. male with a hx of: HLD Nonobstructive CAD Pericarditis HTN BPH  The patient is here with his wife today.  He has had some recent problems with his neck.  He has not had any cardiac related symptoms of late.  He denies chest pain, chest pressure, or shortness of breath.  No heart palpitations, edema, orthopnea, or PND.  He had a recent coronary CTA study that showed mild nonobstructive coronary plaquing.  Overall he feels like he is doing well on his current medical program.  The patient has discontinued rosuvastatin because of joint and muscle stiffness.  He is taking Zetia alone for hyperlipidemia.  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  01/2010 LHC with minimal nonobstructive CAD b.qualifier: Diagnosis of  By: Owens Shark, RN, BSN, Lauren   BPH (benign prostatic hyperplasia)    Essential (primary) hypertension    Mixed hyperlipidemia    Has expereinced some intolerance to statins in the past   Pericarditis     History reviewed. No pertinent surgical history.  Current Medications: Current Meds  Medication Sig   aspirin EC 81 MG tablet Take 1 tablet (81 mg total) by mouth daily. Swallow whole.   chlorhexidine (PERIDEX) 0.12 % solution Swish 15 MILLILITERS in the mouth or throat 2 times daily.   Coenzyme Q10-Vitamin E (QUNOL ULTRA COQ10 PO) Take 1 capsule by mouth daily.   ezetimibe (ZETIA) 10 MG tablet Take 10 mg by mouth daily.   famotidine (PEPCID) 40 MG tablet Take 1 tablet by mouth 2 (two) times daily.   finasteride (PROSCAR) 5 MG tablet Take 5 mg by mouth daily.   fluticasone (FLONASE) 50  MCG/ACT nasal spray Place 1 spray into both nostrils as needed.   lisinopril (ZESTRIL) 5 MG tablet Take 5 mg by mouth daily.   ondansetron (ZOFRAN-ODT) 4 MG disintegrating tablet Take by mouth as needed.   pantoprazole (PROTONIX) 40 MG tablet Take 40 mg by mouth daily.   pramipexole (MIRAPEX) 0.125 MG tablet Take 1-4 tablets by mouth at bedtime.   sertraline (ZOLOFT) 50 MG tablet Take 1 tablet by mouth daily.   tamsulosin (FLOMAX) 0.4 MG CAPS capsule Take 0.8 mg by mouth at bedtime.   traMADol (ULTRAM) 50 MG tablet Take by mouth.     Allergies:   Patient has no known allergies.   Social History   Socioeconomic History   Marital status: Married    Spouse name: Not on file   Number of children: Not on file   Years of education: Not on file   Highest education level: Not on file  Occupational History   Not on file  Tobacco Use   Smoking status: Never   Smokeless tobacco: Former  Scientific laboratory technician Use: Never used  Substance and Sexual Activity   Alcohol use: No   Drug use: Never   Sexual activity: Not on file  Other Topics Concern   Not on file  Social History Narrative   Not on file   Social Determinants of Health  Financial Resource Strain: Not on file  Food Insecurity: Not on file  Transportation Needs: Not on file  Physical Activity: Not on file  Stress: Not on file  Social Connections: Not on file     Family History: The patient's family history includes Hypertension in his father and mother.  ROS:   Please see the history of present illness.    All other systems reviewed and are negative.  EKGs/Labs/Other Studies Reviewed:    The following studies were reviewed today: Cardiac CTA 05/25/21: IMPRESSION: 1. Coronary calcium score of 71.4. This was 28th percentile for age-, sex, and race-matched controls.   2. Normal coronary origin with left dominance.   3. Mild calcified plaque (25-49%) in the mid LAD.   RECOMMENDATIONS: 1. Mild non-obstructive CAD  (25-49%). Consider non-atherosclerotic causes of chest pain. Consider preventive therapy and risk factor modification.  Echo 04/05/20:  1. Left ventricular ejection fraction, by estimation, is 60 to 65%. Left  ventricular ejection fraction by PLAX is 59 %. The left ventricle has  normal function. The left ventricle has no regional wall motion  abnormalities. There is mild concentric left  ventricular hypertrophy. Left ventricular diastolic parameters are  consistent with Grade I diastolic dysfunction (impaired relaxation).  Elevated left ventricular end-diastolic pressure. The average left  ventricular global longitudinal strain is -20.5 %.  The global longitudinal strain is normal.   2. Right ventricular systolic function is normal. The right ventricular  size is normal. There is normal pulmonary artery systolic pressure.   3. The mitral valve is normal in structure. No evidence of mitral valve  regurgitation. No evidence of mitral stenosis.   4. The aortic valve is tricuspid. Aortic valve regurgitation is not  visualized. No aortic stenosis is present.   5. The inferior vena cava is normal in size with greater than 50%  respiratory variability, suggesting right atrial pressure of 3 mmHg.   EKG:  EKG is not ordered today.    Recent Labs: 05/10/2021: BUN 17; Creatinine, Ser 0.97; Potassium 4.9; Sodium 141  Recent Lipid Panel No results found for: "CHOL", "TRIG", "HDL", "CHOLHDL", "VLDL", "LDLCALC", "LDLDIRECT"   Risk Assessment/Calculations:           Physical Exam:    VS:  BP 110/70   Pulse 69   Ht '5\' 9"'$  (1.753 m)   Wt 197 lb (89.4 kg)   SpO2 95%   BMI 29.09 kg/m     Wt Readings from Last 3 Encounters:  08/09/21 197 lb (89.4 kg)  05/10/21 217 lb 9.6 oz (98.7 kg)  04/12/20 198 lb 3.2 oz (89.9 kg)     GEN:  Well nourished, well developed in no acute distress HEENT: Normal NECK: No JVD; No carotid bruits LYMPHATICS: No lymphadenopathy CARDIAC: RRR, no murmurs, rubs,  gallops RESPIRATORY:  Clear to auscultation without rales, wheezing or rhonchi  ABDOMEN: Soft, non-tender, non-distended MUSCULOSKELETAL:  No edema; No deformity  SKIN: Warm and dry NEUROLOGIC:  Alert and oriented x 3 PSYCHIATRIC:  Normal affect   ASSESSMENT:    1. Nonobstructive atherosclerosis of coronary artery   2. Mixed hyperlipidemia   3. Essential hypertension    PLAN:    In order of problems listed above:  The patient's recent CT scan is reviewed and he has mild nonobstructive plaquing.  He will continue on aspirin and Zetia.  His coronary calcium score is only in the 28th percentile.  He does not have any significant obstructive disease noted. Patient is statin intolerant.  We  discussed treatment options through a shared decision-making conversation.  He will continue on ezetimibe and work on lifestyle modification.  Last labs were reviewed through care everywhere.  Total cholesterol is 188, LDL cholesterol 129. Blood pressure is under ideal control on lisinopril.  We will continue the same with no change.     Medication Adjustments/Labs and Tests Ordered: Current medicines are reviewed at length with the patient today.  Concerns regarding medicines are outlined above.  No orders of the defined types were placed in this encounter.  No orders of the defined types were placed in this encounter.   Patient Instructions  Medication Instructions:  Your physician recommends that you continue on your current medications as directed. Please refer to the Current Medication list given to you today.  *If you need a refill on your cardiac medications before your next appointment, please call your pharmacy*   Lab Work: NONE If you have labs (blood work) drawn today and your tests are completely normal, you will receive your results only by: [place] (if you have MyChart) OR A paper copy in the mail If you have any lab test that is abnormal or we need to change your  treatment, we will call you to review the results.   Testing/Procedures: NONE   Follow-Up: At [facility], you and your health needs are our priority.  As part of our continuing mission to provide you with exceptional heart care, we have created designated Provider Care Teams.  These Care Teams include your primary Cardiologist (physician) and Advanced Practice Providers (APPs -  Physician Assistants and Nurse Practitioners) who all work together to provide you with the care you need, when you need it.  We recommend signing up for the patient portal called "MyChart".  Sign up information is provided on this After Visit Summary.  MyChart is used to connect with patients for Virtual Visits (Telemedicine).  Patients are able to view lab/test results, encounter notes, upcoming appointments, etc.  Non-urgent messages can be sent to your provider as well.   To learn more about what you can do with MyChart, go to [email or web address].    Your next appointment:   1 year(s)  The format for your next appointment:   In Person  Provider:   Sherren Mocha, MD      Important Information About Sugar         Signed, Sherren Mocha, MD  08/09/2021 1:19 PM    [place]

## 2022-04-30 ENCOUNTER — Encounter: Payer: Self-pay | Admitting: *Deleted

## 2022-05-01 ENCOUNTER — Ambulatory Visit: Payer: PPO | Admitting: Neurology

## 2022-05-01 ENCOUNTER — Encounter: Payer: Self-pay | Admitting: Neurology

## 2022-05-01 VITALS — BP 152/69 | HR 64 | Ht 69.0 in | Wt 206.4 lb

## 2022-05-01 DIAGNOSIS — G2581 Restless legs syndrome: Secondary | ICD-10-CM

## 2022-05-01 DIAGNOSIS — Z8669 Personal history of other diseases of the nervous system and sense organs: Secondary | ICD-10-CM

## 2022-05-01 NOTE — Progress Notes (Signed)
Subjective:    Patient ID: Randy Ramos Ramos is a 77 y.o. male.  HPI    Randy Ramos Age, MD, PhD [facility] [address] [place], [place] [address]    Dear Dr. Saintclair Halsted,  I saw your patient, Randy Ramos Ramos, upon your kind request in my neurologic clinic today for initial consultation of his restless legs syndrome.  The patient is accompanied by his wife and daughter today.  As you know, Randy Ramos Ramos is a 77 year old male with an underlying medical history of hypertension, hyperlipidemia, history of pericarditis, BPH, OSA, cervical radiculopathy, chronic neck and low back pain, who reports a longstanding history of restless leg symptoms.  Symptoms date back to at least 20 years ago.  In the past 10 years he has become worse.  Symptoms have been treated by primary care and he has tried and failed several medications to help him sleep at night as well.  He is restless at night, symptoms got worse when he tried hydroxyzine for sleep.  His primary care tried him on levodopa which made him nauseated and he even had vomiting on it.  He may have tried trazodone as he recalls which made him sleepy but made his legs worse.  He describes a sensation of restlessness, having to move his legs, twitching and then his legs and symptoms originally started in the evenings but have become worse to where the symptoms started in the late afternoon or early afternoon, sometimes all day and sometimes affect the whole body including upper body.  He has a history of sleep apnea but has not been using his CPAP or AutoPap machine consistently he admits.  He has been sleeping in a recliner.  He limits his caffeine to a third cup of regular coffee per day, otherwise decaf coffee, occasional soda, also decaf.  He drinks no alcohol, he is a non-smoker.  He hydrates well with water.  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  He lives with his wife, his daughter has started noticing some tiredness in  her legs but no frank restless leg symptoms, his son has no symptoms, his older sister had some restless leg symptoms.  He has been on sertraline, currently 25 mg daily to help him sleep at night, it is not helping.  I reviewed your office note from 03/15/2022.  He has a prescription for gabapentin but has not tried it.  He is currently on a high dose of ropinirole, 2 mg strength up to 6 mg daily, sometimes even more than that.  He takes it in half pill increments starting around 5 PM with several half pills taken until bedtime.    He had a MRI of the cervical spine without contrast through your office on 07/20/2021 and I reviewed the report: Impression: Multilevel degenerative changes as above with uncovertebral spurring and facet arthropathy at C5-C6 and C7-T1 resulting in severe left neural foraminal stenosis.  Moderate to severe right neuroforaminal stenosis at C3-C4 and moderate right neural foraminal stenosis and moderate spinal canal stenosis at C4-C6.  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  He does have the severe foraminal stenosis on the left at C7-T1 which could produce C8 radiculopathy.  On EMG, the C8 paraspinals did  test normal but that could be due to to sparing of the cervicals paraspinals or early reinnervation.  However, there were some subtle findings that could also be consistent with lower trunk plexopathy which includes borderline low median sensory amplitudes left versus right along with the normal study of the cervical paraspinals.  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  Possibly a C8 nerve root block could add some diagnostic information.    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  01/2010 LHC with minimal nonobstructive CAD b.qualifier: Diagnosis of  By: Owens Shark, RN, BSN, Lauren   BPH (benign prostatic hyperplasia)    Essential (primary) hypertension    Mixed hyperlipidemia    Has expereinced some intolerance to statins in the past   Pericarditis     His Past Surgical History Is Significant For: History reviewed. No pertinent surgical history.  His Family History Is Significant For: Family History  Problem Relation Ramos of Onset   Hypertension Mother    Hypertension Father    Restless legs syndrome Sister     His Social History Is Significant For: Social History   Socioeconomic History   Marital status: Married    Spouse name: Not on file   Number of children: Not on file   Years of education: Not on file   Highest education level: Not on file  Occupational History   Not on file  Tobacco Use   Smoking status: Never   Smokeless tobacco: Former  Scientific laboratory technician Use: Never used  Substance and Sexual Activity   Alcohol use: No   Drug use: Never   Sexual activity: Not on file  Other Topics Concern   Not on file  Social History Narrative   Not on file   Social Determinants of Health   Financial Resource Strain: Not on file  Food Insecurity: Not on file  Transportation Needs: Not on file  Physical Activity: Not on file  Stress: Not on file  Social Connections: Not on file    His Allergies Are:  No Known Allergies:   His Current Medications Are:  Outpatient Encounter Medications as of 05/01/2022  Medication Sig   aspirin EC 81 MG tablet Take 1 tablet (81 mg total) by mouth daily. Swallow whole.   chlorhexidine (PERIDEX) 0.12 % solution Swish 15 MILLILITERS in the mouth or throat 2 times daily.   Coenzyme Q10-Vitamin E (QUNOL ULTRA COQ10 PO) Take 1 capsule by mouth daily.   ezetimibe (ZETIA) 10 MG tablet Take 10 mg by mouth daily.   famotidine (PEPCID) 40 MG  tablet Take 1 tablet by mouth 2 (two) times daily.   finasteride (PROSCAR) 5 MG tablet Take 5 mg by mouth daily.   fluticasone (FLONASE) 50 MCG/ACT nasal spray Place 1 spray into both nostrils as needed.   lisinopril (ZESTRIL) 5 MG tablet Take 5 mg by mouth daily.   ondansetron (ZOFRAN-ODT) 4 MG disintegrating tablet Take by mouth as needed.   pantoprazole (PROTONIX) 40 MG tablet Take 40 mg by mouth daily.   rOPINIRole (REQUIP) 2 MG tablet Take by mouth.   sertraline (ZOLOFT) 50 MG tablet Take 1 tablet by mouth daily.   tamsulosin (FLOMAX) 0.4 MG CAPS capsule Take 0.8 mg by mouth at bedtime.   traMADol (ULTRAM) 50 MG tablet Take by mouth.   metoprolol tartrate (LOPRESSOR) 50 MG tablet Take one tablet two hours prior to  cardiac CTA.   pramipexole (MIRAPEX) 0.125 MG tablet Take 1-4 tablets by mouth at bedtime.   rosuvastatin (CRESTOR) 10 MG tablet Take 1 tablet (10 mg total) by mouth daily.   [DISCONTINUED] ondansetron (ZOFRAN-ODT) 4 MG disintegrating tablet Take by mouth as needed.   No facility-administered encounter medications on file as of 05/01/2022.  :   Review of Systems:  Out of a complete 14 point review of systems, all are reviewed and negative with the exception of these symptoms as listed below:  Review of Systems  Neurological:        Pt here for RLS  Pt states RLS in both legs Pt states symptoms of RLS is worse at night .     Objective:  Neurological Exam  Physical Exam Physical Examination:   Vitals:   05/01/22 0802  BP: (!) 152/69  Pulse: 64    General Examination: The patient is a very pleasant 77 y.o. male in no acute distress. He appears well-developed and well-nourished and well groomed.   HEENT: Normocephalic, atraumatic, pupils are equal, round and reactive to light, extraocular tracking is good without limitation to gaze excursion or nystagmus noted. Hearing is grossly intact. Face is symmetric with normal facial animation. Speech is clear with no  dysarthria noted. There is no hypophonia. There is no lip, neck/head, jaw or voice tremor. Neck is supple with full range of passive and active motion. There are no carotid bruits on auscultation. Oropharynx exam reveals: moderate mouth dryness, adequate dental hygiene and moderate airway crowding.  Tongue protrudes centrally and palate elevates symmetrically.  Chest: Clear to auscultation without wheezing, rhonchi or crackles noted.  Heart: S1+S2+0, regular and normal without murmurs, rubs or gallops noted.   Abdomen: Soft, non-tender and non-distended.  Extremities: There is no pitting edema in the distal lower extremities bilaterally.   Skin: Warm and dry without trophic changes noted.   Musculoskeletal: exam reveals no obvious joint deformities.   Neurologically:  Mental status: The patient is awake, alert and oriented in all 4 spheres. His immediate and remote memory, attention, language skills and fund of knowledge are appropriate. There is no evidence of aphasia, agnosia, apraxia or anomia. Speech is clear with normal prosody and enunciation. Thought process is linear. Mood is normal and affect is normal.  Cranial nerves II - XII are as described above under HEENT exam.  Motor exam: Normal bulk, strength and tone is noted. There is no obvious action or resting tremor.  Reflexes are 2+ throughout including ankles.   Fine motor skills and coordination: grossly intact.  Cerebellar testing: No dysmetria or intention tremor. There is no truncal or gait ataxia.  Sensory exam: intact to light touch in the upper and lower extremities.  Gait, station and balance: He stands easily. No veering to one side is noted. No leaning to one side is noted. Posture is Ramos-appropriate and stance is narrow based. Gait shows normal stride length and normal pace. No problems turning are noted.   Assessment and plan:  In summary, Randy Ramos Ramos is a very pleasant 77 y.o.-year old male with an underlying medical  history of hypertension, hyperlipidemia, history of pericarditis, BPH, OSA, cervical radiculopathy, chronic neck and low back pain, who presents for evaluation of his restless leg syndrome of several years duration, at least 20 years.  He has classic presentation and also has manifested with augmentation at this point.  He has tried pramipexole in the past but required a higher dose per his report, this  was prescribed by his PCP and he is currently on a high dose of ropinirole and I believe he needs to cut back on it.  He is advised to take no more than 4 mg each evening or throughout the day.  He is reminded that augmentation can manifest as symptoms earlier in the day, symptoms not just limited to lower extremities, symptoms affecting the entire body with day-to-day fluctuation.  He is advised to talk to his PCP about coming off of sertraline as he does not believe he benefits from it.  He is advised that SSRI type medications can exacerbate restless leg symptoms and PLM's.  He is advised that there is a correlation between sleep apnea and restless leg symptoms as well as leg movements at night and treating obstructive sleep apnea with a CPAP or AutoPap machine will likely be beneficial for his restless legs and leg movements.  He declines a sleep study today.  He is advised to stay well-hydrated with water and not overdo physical exertion, exercise in moderation can help with restless legs.  He is encouraged to try the gabapentin prescription he has, he has a 300 mg strength prescription.  He is encouraged to take it somewhere between 6 PM and bedtime once daily.  We will do blood work today to see if there is any other contributors to his restless legs exacerbation such as thyroid dysfunction and iron deficiency.  We will also check B12 level and B1 and B6 levels.  We will call him with his blood test results and plan to follow-up in this clinic in about 4 to 6 months, he can see one of our nurse practitioners.   I answered all the questions today and the patient and his family were in agreement.  This was an extended visit of over 60 minutes with extended chart review and counseling involved.   Thank you very much for allowing me to participate in the care of this nice patient. If I can be of any further assistance to you please do not hesitate to call me at [phone].  Sincerely,   Randy Ramos Age, MD, PhD

## 2022-05-01 NOTE — Patient Instructions (Addendum)
Restless leg symptoms can be induced are aggravated by certain conditions including thyroid dysfunction and anemia or iron deficiency. Certain medications can induce leg twitching at night, which we call periodic leg movements and disrupt sleep and also exacerbate restless leg symptoms during wakefulness. These medications often include antidepressants.   If you have not benefited from sertraline, please talk to your primary care physician about coming off of it.  We will do some blood work today and call you with the test results. I may ask you to start an over-the-counter iron supplement if your storage iron values are low.  You are on high dose requip for your restless legs, I would not recommend going higher, in fact, I would recommend that you limit yourself to 4 mg each day, no more than that.  I think you are already experiencing what we call augmentation of your restless legs symptoms, that is: accelerated symptoms, affecting your upper body and sooner in the day, rather than just at night. Requip can cause swelling of the legs as well.   You can start using the gabapentin prescription you were given, take it between 6 PM and bedtime.  We will call you with your blood test results and follow-up in this office in 4 to 6 months.  Please get back on your CPAP machine or AutoPap machine.  Treating sleep apnea has been shown to aid in restless legs management and reduce leg twitching at night.

## 2022-05-03 ENCOUNTER — Telehealth: Payer: Self-pay | Admitting: *Deleted

## 2022-05-03 NOTE — Telephone Encounter (Signed)
-----   Message from Star Age, MD sent at 05/02/2022  4:27 PM EDT ----- Please call patient and advise him that his iron studies show very low iron.  This can tie-in with restless leg symptoms, I recommend he start an over-the-counter iron supplement and follow the instructions on the bottle, 1 pill once daily generally speaking. I recommend follow-up with primary care to discuss reasons for iron deficiency and possible anemia, and further evaluation as necessary, such as blood loss, as well as treatment options for iron deficiency and anemia.  2 of the test results are pending which is the vitamin B6 and vitamin B1 levels, we will update if abnormal.  They do tend to take a few days longer to come back.

## 2022-05-03 NOTE — Telephone Encounter (Signed)
I called the patient and LVM (ok per DPR) advising him of his lab results as noted below.  Dr. Rexene Alberts states his iron studies show very low iron.  I advised him that this can tie in with restless leg symptoms.  Dr. Rexene Alberts recommends he start an over-the-counter iron supplement and follow the instructions on the bottle, typically this is 1 pill once a day.  Dr. Rexene Alberts also recommends that he follow-up with primary care to discuss the reasons for this iron deficiency, possible anemia, be further evaluated as necessary, checking for any blood loss or treatment options for iron deficiency and anemia.  I advised that the vitamin B1 and vitamin B6 levels are pending still and we will call only if these are abnormal.  I asked him to give his primary care physician a call and also advised that I would be sending the results over to primary care.  Lab results forwarded to Dr Janace Litten, pt's PCP.

## 2022-05-04 ENCOUNTER — Telehealth: Payer: Self-pay | Admitting: Cardiovascular Disease

## 2022-05-04 LAB — IRON AND TIBC
Iron Saturation: 5 % — CL (ref 15–55)
Iron: 21 ug/dL — ABNORMAL LOW (ref 38–169)
Total Iron Binding Capacity: 396 ug/dL (ref 250–450)
UIBC: 375 ug/dL — ABNORMAL HIGH (ref 111–343)

## 2022-05-04 LAB — VITAMIN D 25 HYDROXY (VIT D DEFICIENCY, FRACTURES): Vit D, 25-Hydroxy: 47.9 ng/mL (ref 30.0–100.0)

## 2022-05-04 LAB — B12 AND FOLATE PANEL
Folate: 9.7 ng/mL (ref >3.0)
Vitamin B-12: 668 pg/mL (ref 232–1245)

## 2022-05-04 LAB — HGB A1C W/O EAG: Hgb A1c MFr Bld: 6.3 % — ABNORMAL HIGH (ref 4.8–5.6)

## 2022-05-04 LAB — TSH: TSH: 2.78 u[IU]/mL (ref 0.450–4.500)

## 2022-05-04 LAB — VITAMIN B6: Vitamin B6: 8.1 ug/L (ref 3.4–65.2)

## 2022-05-04 LAB — FERRITIN: Ferritin: 11 ng/mL — ABNORMAL LOW (ref 30–400)

## 2022-05-04 LAB — VITAMIN B1: Thiamine: 119 nmol/L (ref 66.5–200.0)

## 2022-05-04 LAB — SEDIMENTATION RATE: Sed Rate: 2 mm/hr (ref 0–30)

## 2022-05-04 NOTE — Telephone Encounter (Signed)
Patient's daughter states that her father has restless leg syndrome and she wants to know if this is something that would be better treated by Dr. Burt Knack rather than his PCP. He saw Dr. Rexene Alberts earlier this week and he has low Iron as well as low Ferritin. He has been started on an iron supplement. She is concerned about him not having a CBC since 2022 and also, wanted to know if he needed another CTA (had one in 05/2021). She states he may need a dose of IV Iron as his legs are "driving him crazy". Please advise, daughter thinks Dr. Burt Knack would be able to manage this diagnosis better than his PCP.

## 2022-05-04 NOTE — Telephone Encounter (Signed)
Reviewed patients chart and he had a very thorough workup yesterday with neurology for restless legs. He has a very extensive history (about 20 years) with many tried and failed medications. Called and spoke with daughter to advise that Dr Burt Knack would defer to neurology for best treatment for the patient. Daughter verbalzies understanding. States her primary concern was his iron level and how it needed to be addressed. She has started on OTC iron supplement per recommendation of neuro. Advised that she call PCP to do repeat labs to see if levels are improving and if/when his levels would merit a transfusion. Advised she could ask for referral to hematology if she would feel better about seeing a specialist. She understands there's no need to repeat CTA done last year, states this was a misunderstanding with the operator. Will call back with any additional concerns.

## 2022-05-07 ENCOUNTER — Encounter: Payer: Self-pay | Admitting: Neurology

## 2022-05-09 ENCOUNTER — Telehealth: Payer: Self-pay | Admitting: Hematology

## 2022-05-09 NOTE — Telephone Encounter (Signed)
scheduled per 4/3 referral, pt has been called and confirmed date and time. Pt is aware of location and to arrive early for check in

## 2022-05-25 ENCOUNTER — Inpatient Hospital Stay: Payer: PPO | Attending: Hematology | Admitting: Hematology

## 2022-05-25 ENCOUNTER — Other Ambulatory Visit: Payer: Self-pay

## 2022-05-25 ENCOUNTER — Inpatient Hospital Stay: Payer: PPO

## 2022-05-25 VITALS — BP 131/64 | HR 73 | Temp 97.7°F | Resp 15 | Wt 208.9 lb

## 2022-05-25 DIAGNOSIS — I10 Essential (primary) hypertension: Secondary | ICD-10-CM | POA: Diagnosis not present

## 2022-05-25 DIAGNOSIS — G2581 Restless legs syndrome: Secondary | ICD-10-CM | POA: Diagnosis not present

## 2022-05-25 DIAGNOSIS — D509 Iron deficiency anemia, unspecified: Secondary | ICD-10-CM

## 2022-05-25 DIAGNOSIS — Z79899 Other long term (current) drug therapy: Secondary | ICD-10-CM | POA: Diagnosis not present

## 2022-05-25 DIAGNOSIS — E611 Iron deficiency: Secondary | ICD-10-CM | POA: Insufficient documentation

## 2022-05-25 NOTE — Progress Notes (Signed)
HEMATOLOGY/ONCOLOGY CONSULTATION NOTE  Date of Service: 05/25/2022  Patient Care Team: Hadley Pen, MD as PCP - General (Family Medicine) Tonny Bollman, MD as PCP - Cardiology (Cardiology)  CHIEF COMPLAINTS/PURPOSE OF CONSULTATION:  Evaluation and management of low ferritin level of 5 without significant anemia.  HISTORY OF PRESENTING ILLNESS:   Randy Ramos is a wonderful 77 y.o. male who has been referred to [place] by Molly Maduro A. Sherral Hammers, MD for evaluation and management of low ferritin levels related to iron deficiency without anemia.   Today, he is accompanied by his wife. He reports that this is the first time he has been iron deficient. He reports that his iron work-up was triggered by his restless leg syndrome.  Patient denies any nose bleeds, gum bleeds, black stools, blood in stool/urine, sudden weight changes, or recent surgeries. He denies any dietary restrictions/intolerances or previous bowel surgeries that may have caused absorption issues.  He reports that his stomach has been sensitive for a while and denies any stomach ulcers.  Patient denies any GI issues besides acid reflux. He has taken Protonix for several years and does also take Pepsid.  Patient last received a colonoscopy in 2020 and an endoscopy 1 year ago. There was no concern for ulcers at the time. He does note previous benign polyps. Patient denies any recent stool testing.  He does complain of frequent pain in his abdomen which he describes as a knot that radiates to his back. He believes the pain is triggered by foods. He denies any previous hernia.   Patient does endorse worsened memory function.   He complains of worsened restless legs syndrome over the last few months. He has had this disease for approximately 20 years. Patient describes that symptoms begin in his bilateral legs and radiate throughout his body. His symptoms do cause sleep disturbances. He reports that his symptoms are especially  bothersome when he sits/lays down. He reports that medications such as Mirapex did previously improve symptoms, but did not resolve. He denies any previous concern for neuropathy. He reports that caffeine and strong medications does worsens symptoms. Patient has tried essential oils and tonic water to manage restless legs. He also reports taking Gabapentin for two days, which worsened restless leg symptoms. He has not tried taking Benedryl to manage restless less. Patient does endorse a lack of energy, which he attributes to restless leg. Patient is concerned as his restless leg syndrome does limit his social life. Patient does not have a sleep doctor. He is unsure if his thyroid functions have been previously checked.   Patient was diagnosed with sleep apnea 5-6 years ago but does not currently use a CPAP machine. His weight has been fluctuating between 180-210 pounds over the last 5-6 years.   Patient regularly stays active but denies any strenuous exercise. His wife reports that he is slightly SOB when walking. Patient denies any back pain, weight changes, change in bowel habits, or abdominal pain.  Patient has been taking 45 MG p.o. iron tablets for two weeks, which causes abdominal pain.   In regards to his diet, patient consumes meat approximately once a week. Patient does regularly drink coffee in the mornings and drinks sodas occasionally.    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  01/2010 LHC with minimal nonobstructive CAD b.qualifier: Diagnosis of  By: Manson Passey, RN, BSN, Lauren   BPH (benign prostatic hyperplasia)    Essential (primary) hypertension  Mixed hyperlipidemia    Has expereinced some intolerance to statins in the past   Pericarditis     SURGICAL HISTORY: No past surgical history on file.  SOCIAL HISTORY: Social History   Socioeconomic History   Marital status: Married    Spouse name: Not on file   Number of  children: Not on file   Years of education: Not on file   Highest education level: Not on file  Occupational History   Not on file  Tobacco Use   Smoking status: Never   Smokeless tobacco: Former  Building services engineer Use: Never used  Substance and Sexual Activity   Alcohol use: No   Drug use: Never   Sexual activity: Not on file  Other Topics Concern   Not on file  Social History Narrative   Not on file   Social Determinants of Health   Financial Resource Strain: Not on file  Food Insecurity: Not on file  Transportation Needs: Not on file  Physical Activity: Not on file  Stress: Not on file  Social Connections: Not on file  Intimate Partner Violence: Not on file    FAMILY HISTORY: Family History  Problem Relation Age of Onset   Hypertension Mother    Hypertension Father    Restless legs syndrome Sister     ALLERGIES:  has No Known Allergies.  MEDICATIONS:  Current Outpatient Medications  Medication Sig Dispense Refill   aspirin EC 81 MG tablet Take 1 tablet (81 mg total) by mouth daily. Swallow whole. 90 tablet 3   chlorhexidine (PERIDEX) 0.12 % solution Swish 15 MILLILITERS in the mouth or throat 2 times daily.     Coenzyme Q10-Vitamin E (QUNOL ULTRA COQ10 PO) Take 1 capsule by mouth daily.     ezetimibe (ZETIA) 10 MG tablet Take 10 mg by mouth daily.  2   famotidine (PEPCID) 40 MG tablet Take 1 tablet by mouth 2 (two) times daily.     finasteride (PROSCAR) 5 MG tablet Take 5 mg by mouth daily.  3   fluticasone (FLONASE) 50 MCG/ACT nasal spray Place 1 spray into both nostrils as needed.     lisinopril (ZESTRIL) 5 MG tablet Take 5 mg by mouth daily.     metoprolol tartrate (LOPRESSOR) 50 MG tablet Take one tablet two hours prior to cardiac CTA. 1 tablet 0   ondansetron (ZOFRAN-ODT) 4 MG disintegrating tablet Take by mouth as needed.     pantoprazole (PROTONIX) 40 MG tablet Take 40 mg by mouth daily.     pramipexole (MIRAPEX) 0.125 MG tablet Take 1-4 tablets by  mouth at bedtime.  0   rOPINIRole (REQUIP) 2 MG tablet Take by mouth.     rosuvastatin (CRESTOR) 10 MG tablet Take 1 tablet (10 mg total) by mouth daily. 90 tablet 3   sertraline (ZOLOFT) 50 MG tablet Take 1 tablet by mouth daily.     tamsulosin (FLOMAX) 0.4 MG CAPS capsule Take 0.8 mg by mouth at bedtime.  3   traMADol (ULTRAM) 50 MG tablet Take by mouth.     No current facility-administered medications for this visit.    REVIEW OF SYSTEMS:    10 Point review of Systems was done is negative except as noted above.  PHYSICAL EXAMINATION: ECOG PERFORMANCE STATUS: 1 - Symptomatic but completely ambulatory  . Vitals:   05/25/22 1425  BP: 131/64  Pulse: 73  Resp: 15  Temp: 97.7 F (36.5 C)  SpO2: 97%   Filed Weights  05/25/22 1425  Weight: 208 lb 14.4 oz (94.8 kg)   .Body mass index is 30.85 kg/m.  GENERAL:alert, in no acute distress and comfortable SKIN: no acute rashes, no significant lesions EYES: conjunctiva are pink and non-injected, sclera anicteric OROPHARYNX: MMM, no exudates, no oropharyngeal erythema or ulceration NECK: supple, no JVD LYMPH:  no palpable lymphadenopathy in the cervical, axillary or inguinal regions LUNGS: clear to auscultation b/l with normal respiratory effort HEART: regular rate & rhythm ABDOMEN:  normoactive bowel sounds , non tender, not distended. No hepatosplenomegaly. Extremity: no pedal edema PSYCH: alert & oriented x 3 with fluent speech NEURO: no focal motor/sensory deficits  LABORATORY DATA:  I have reviewed the data as listed  Iron panel 05/07/2022:    Marland Kitchen    Latest Ref Rng & Units 03/31/2020    7:30 PM 01/31/2010   10:58 AM 12/21/2009    8:14 AM  CBC  WBC 4.0 - 10.5 K/uL 14.7  7.0    Hemoglobin 13.0 - 17.0 g/dL 16.1  09.6  04.5   Hematocrit 39.0 - 52.0 % 44.4  40.8    Platelets 150 - 400 K/uL 273  208.0      .    Latest Ref Rng & Units 05/10/2021   11:21 AM 03/31/2020    7:30 PM 01/31/2010   10:58 AM  CMP  Glucose  70 - 99 mg/dL 409  811  79   BUN 8 - 27 mg/dL 17  21  17    Creatinine 0.76 - 1.27 mg/dL 9.14  7.82  0.9   Sodium 134 - 144 mmol/L 141  137  139   Potassium 3.5 - 5.2 mmol/L 4.9  3.8  4.4   Chloride 96 - 106 mmol/L 102  100  101   CO2 20 - 29 mmol/L 24  27  31    Calcium 8.6 - 10.2 mg/dL 9.8  9.3  9.5      RADIOGRAPHIC STUDIES: I have personally reviewed the radiological images as listed and agreed with the findings in the report. No results found.  ASSESSMENT & PLAN:   Wonderful 77 y.o. male with:  Severe Iron deficiency without significant anemia but with severe restless leg syndrome. Gerd Hypertension Cervial radiculopathy Osteoarthritis BPH Hyperlipidemia Restless leg syndrome Depression   PLAN:  -Discussed lab results on 03/31/2020 with patient in detail. CBC showed WBC of 14.7K, hemoglobin of 14.1, and platelets of 273K. No anemia. -Patient's ferritin level is 5 L, discussed goal level of 50-100 -discussed proceeding treatment options such as: oral polysaccharide/liquid iron. Discussed that the gut absorbs 10-20% of iron input. Recommended patient to take along with orange juice for optimal iron absorption IV Injectafer/Venofer iron. Patient is not anemic at this time. Patient is likely deficient in 500-750 MG of iron. Discussed options to reduce risk of allergic reactions. -recommended patient to use compression socks  -informed patient that Pepcid would not be as effective as an acid suppressant as much as Protonix would -discussed that iron deficiency may be a reversible cause of RLS  -Discussed that Protonix may be risk factor for iron deficiency as well as other deficiencies such as B12 or vitamin D -Would recommend further workup to test for any vitamin B12 or D deficiencies -discussed treatment option such as Valium to treat restless leg syndrome is symptoms remain bothersome after pt's sleep apnea is addressed -Discussed option of OTC melatonin to improve sleep  disturbances related to restless-leg  -Advised patient to limit caffeine intake, especially in the evenings  to improve restless-leg symptoms -recommend iron-rick foods such as dates or green vegetables to optimize iron levels -Patient has no major new symptoms such as obvious bleeding issues or bowel changes. No signs of anemia at this time. -if patient is deficient in any other vitamins, this may suggest an absorption issue -Will make referral to GI for further workup -will order stool testing -If stool testing is positive, would recommend a repeat colonoscopy and endoscopy -will repeat labs in 2-3 months  FOLLOW-UP: Labs today IV injectafer 750mg  x 1 dose GI referral for Iron deficiency Anemia RTC with Dr Candise Che with labs in 3 months  The total time spent in the appointment was31 minutes* .  All of the patient's questions were answered with apparent satisfaction. The patient knows to call the clinic with any problems, questions or concerns.   Wyvonnia Lora MD MS AAHIVMS [facility] Hematology/Oncology Physician [facility]  .*Total Encounter Time as defined by the Centers for Medicare and Medicaid Services includes, in addition to the face-to-face time of a patient visit (documented in the note above) non-face-to-face time: obtaining and reviewing outside history, ordering and reviewing medications, tests or procedures, care coordination (communications with other health care professionals or caregivers) and documentation in the medical record.    I,Mitra Faeizi,acting as a Neurosurgeon for Wyvonnia Lora, MD.,have documented all relevant documentation on the behalf of Wyvonnia Lora, MD,as directed by  Wyvonnia Lora, MD while in the presence of Wyvonnia Lora, MD.  .I have reviewed the above documentation for accuracy and completeness, and I agree with the above. Johney Maine MD

## 2022-05-28 ENCOUNTER — Telehealth: Payer: Self-pay | Admitting: Hematology

## 2022-05-28 NOTE — Telephone Encounter (Signed)
Called patient's home and cell phones. Both unable to leave a voicemail. Mailing patient reminders and calendars.

## 2022-05-30 ENCOUNTER — Encounter: Payer: Self-pay | Admitting: Hematology

## 2022-05-30 ENCOUNTER — Telehealth: Payer: Self-pay | Admitting: Hematology

## 2022-05-30 ENCOUNTER — Ambulatory Visit (INDEPENDENT_AMBULATORY_CARE_PROVIDER_SITE_OTHER): Payer: PPO

## 2022-05-30 ENCOUNTER — Encounter: Payer: Self-pay | Admitting: Podiatry

## 2022-05-30 ENCOUNTER — Ambulatory Visit: Payer: PPO | Admitting: Podiatry

## 2022-05-30 DIAGNOSIS — R52 Pain, unspecified: Secondary | ICD-10-CM

## 2022-05-30 DIAGNOSIS — M722 Plantar fascial fibromatosis: Secondary | ICD-10-CM | POA: Diagnosis not present

## 2022-05-30 MED ORDER — MELOXICAM 15 MG PO TABS
15.0000 mg | ORAL_TABLET | Freq: Every day | ORAL | 0 refills | Status: DC
Start: 2022-05-30 — End: 2022-07-19

## 2022-05-30 MED ORDER — DEXAMETHASONE SODIUM PHOSPHATE 120 MG/30ML IJ SOLN
4.0000 mg | Freq: Once | INTRAMUSCULAR | Status: AC
Start: 2022-05-30 — End: 2022-05-30
  Administered 2022-05-30: 4 mg via INTRA_ARTICULAR

## 2022-05-30 NOTE — Telephone Encounter (Signed)
Called patient per 4/24 secure chat to reschedule 5/3 appointment for earlier time due to new scheduling conflict. Patient notified.

## 2022-05-30 NOTE — Progress Notes (Signed)
  Subjective:  Patient ID: Randy Ramos, male    DOB: [birth year],   MRN: [id number]  Chief Complaint  Patient presents with   Foot Pain    Pain located in the left heel, pain has been ongoing for two weeks progressively getting worse, pressure causes pain to occur     77 y.o. male presents for concern as above. Relates first steps after rest are most painful and has tried some anti-inflammatories with relief . Denies any other pedal complaints. Denies n/v/f/c.   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  01/2010 LHC with minimal nonobstructive CAD b.qualifier: Diagnosis of  By: Manson Passey, RN, BSN, Lauren   BPH (benign prostatic hyperplasia)    Essential (primary) hypertension    Mixed hyperlipidemia    Has expereinced some intolerance to statins in the past   Pericarditis     Objective:  Physical Exam: Vascular: DP/PT pulses 2/4 bilateral. CFT <3 seconds. Normal hair growth on digits. No edema.  Skin. No lacerations or abrasions bilateral feet.  Musculoskeletal: MMT 5/5 bilateral lower extremities in DF, PF, Inversion and Eversion. Deceased ROM in DF of ankle joint. Tender to medial calcaneal tubercle on the right . No pain along PT or arch. No pain along achilles or with calcaneal squeeze.  Neurological: Sensation intact to light touch.   Assessment:   1. Plantar fasciitis, left      Plan:  Patient was evaluated and treated and all questions answered. Discussed plantar fasciitis with patient.  X-rays reviewed and discussed with patient. No acute fractures or dislocations noted. Mild spurring noted at inferior calcaneus.  Discussed treatment options including, ice, NSAIDS, supportive shoes, bracing, and stretching. Stretching exercises provided to be done on a daily basis.   Prescription for meloxicam provided and sent to pharmacy. CMP reviewed. Cr and BUN in normal limits.  Patient requesting injection today. Procedure note below.   Pf brace  dispensed.  Follow-up 6 weeks or sooner if any problems arise. In the meantime, encouraged to call the office with any questions, concerns, change in symptoms.   Procedure:  Discussed etiology, pathology, conservative vs. surgical therapies. At this time a plantar fascial injection was recommended.  The patient agreed and a sterile skin prep was applied.  An injection consisting of  dexamethasone and marcaine mixture was infiltrated at the point of maximal tenderness on the left Heel.  Bandaid applied. The patient tolerated this well and was given instructions for aftercare.    Louann Sjogren, DPM

## 2022-05-30 NOTE — Patient Instructions (Signed)

## 2022-05-31 ENCOUNTER — Other Ambulatory Visit: Payer: Self-pay | Admitting: *Deleted

## 2022-05-31 DIAGNOSIS — D509 Iron deficiency anemia, unspecified: Secondary | ICD-10-CM

## 2022-05-31 DIAGNOSIS — E611 Iron deficiency: Secondary | ICD-10-CM | POA: Diagnosis not present

## 2022-05-31 LAB — OCCULT BLOOD X 1 CARD TO LAB, STOOL
Fecal Occult Bld: NEGATIVE
Fecal Occult Bld: NEGATIVE
Fecal Occult Bld: NEGATIVE

## 2022-06-01 ENCOUNTER — Encounter: Payer: Self-pay | Admitting: Hematology

## 2022-06-01 ENCOUNTER — Other Ambulatory Visit: Payer: Self-pay

## 2022-06-01 DIAGNOSIS — D509 Iron deficiency anemia, unspecified: Secondary | ICD-10-CM

## 2022-06-08 ENCOUNTER — Inpatient Hospital Stay: Payer: PPO

## 2022-06-08 ENCOUNTER — Inpatient Hospital Stay: Payer: PPO | Attending: Hematology

## 2022-06-08 VITALS — BP 141/63 | HR 64 | Temp 97.9°F | Resp 18

## 2022-06-08 DIAGNOSIS — E611 Iron deficiency: Secondary | ICD-10-CM | POA: Diagnosis present

## 2022-06-08 DIAGNOSIS — D509 Iron deficiency anemia, unspecified: Secondary | ICD-10-CM

## 2022-06-08 MED ORDER — LORATADINE 10 MG PO TABS
10.0000 mg | ORAL_TABLET | Freq: Once | ORAL | Status: AC
  Administered 2022-06-08: 10 mg via ORAL
  Filled 2022-06-08: qty 1

## 2022-06-08 MED ORDER — ACETAMINOPHEN 325 MG PO TABS
650.0000 mg | ORAL_TABLET | Freq: Once | ORAL | Status: AC
  Administered 2022-06-08: 650 mg via ORAL
  Filled 2022-06-08: qty 2

## 2022-06-08 MED ORDER — SODIUM CHLORIDE 0.9 % IV SOLN
750.0000 mg | Freq: Once | INTRAVENOUS | Status: AC
  Administered 2022-06-08: 750 mg via INTRAVENOUS
  Filled 2022-06-08: qty 15

## 2022-06-08 MED ORDER — SODIUM CHLORIDE 0.9 % IV SOLN: Freq: Once | INTRAVENOUS | Status: AC

## 2022-06-08 NOTE — Patient Instructions (Signed)
Ferric Carboxymaltose Injection What is this medication? FERRIC CARBOXYMALTOSE (FER ik kar BOX ee MAWL tose) treats low levels of iron in your body (iron deficiency anemia). Iron is a mineral that plays an important role in making red blood cells, which carry oxygen from your lungs to the rest of your body. This medicine may be used for other purposes; ask your health care provider or pharmacist if you have questions. COMMON BRAND NAME(S): Injectafer What should I tell my care team before I take this medication? They need to know if you have any of these conditions: High blood pressure Hyperparathyroidism Inflammatory bowel disease Low levels of vitamin D in your blood Previously received ferric carboxymaltose Problems absorbing certain vitamins or phosphate in your body An unusual or allergic reaction to iron, other medications, foods, dyes, or preservatives Pregnant or trying to get pregnant Breastfeeding How should I use this medication? This medication is injected into a vein. It is given by your care team in a hospital or clinic setting. Talk to your care team about the use of this medication in children. While it may be given to children as young as 1 year for selected conditions, precautions do apply. Overdosage: If you think you have taken too much of this medicine contact a poison control center or emergency room at once. NOTE: This medicine is only for you. Do not share this medicine with others. What if I miss a dose? Keep appointments for follow-up doses. It is important not to miss your dose. Call your care team if you are unable to keep an appointment. What may interact with this medication? Do not take this medication with any of the following: Deferoxamine Dimercaprol Other iron products This list may not describe all possible interactions. Give your health care provider a list of all the medicines, herbs, non-prescription drugs, or dietary supplements you use. Also tell  them if you smoke, drink alcohol, or use illegal drugs. Some items may interact with your medicine. What should I watch for while using this medication? Visit your care team for regular checks on your progress. Tell your care team if your symptoms do not start to get better or if they get worse. You may need blood work while you are taking this medication. You may need to eat more foods that contain iron. Talk to your care team. Foods that contain iron include whole grains/cereals, dried fruits, beans, or peas, leafy green vegetables, and organ meats (liver, kidney). What side effects may I notice from receiving this medication? Side effects that you should report to your care team as soon as possible: Allergic reactions--skin rash, itching, hives, swelling of the face, lips, tongue, or throat Increase in blood pressure Low blood pressure--dizziness, feeling faint or lightheaded, blurry vision Low phosphorus level--fatigue, muscle weakness or pain, bone or joint pain, bone fractures Shortness of breath Side effects that usually do not require medical attention (report to your care team if they continue or are bothersome): Flushing Headache Nausea Pain, redness, or irritation at injection site Vomiting This list may not describe all possible side effects. Call your doctor for medical advice about side effects. You may report side effects to FDA at 1-800-FDA-1088. Where should I keep my medication? This medication is given in a hospital or clinic. It will not be stored at home. NOTE: This sheet is a summary. It may not cover all possible information. If you have questions about this medicine, talk to your doctor, pharmacist, or health care provider.  2023 Elsevier/Gold   Standard (2020-02-19 00:00:00)  

## 2022-06-12 ENCOUNTER — Telehealth: Payer: Self-pay | Admitting: Internal Medicine

## 2022-06-12 NOTE — Telephone Encounter (Signed)
Patient daughter called to schedule. States that the patient had a test for low ferritin. States she is unsure if there is active bleeding. She is requesting to speak with a nurse to see if something could be done sooner that 08/21. She is also requesting Dr. Rhea Belton. Please advise, thank you.

## 2022-06-12 NOTE — Telephone Encounter (Signed)
Left message for pt to call back  °

## 2022-06-12 NOTE — Telephone Encounter (Signed)
Patient's daughter is returning Linda's call

## 2022-06-15 NOTE — Telephone Encounter (Signed)
Pt scheduled to see Alcide Evener NP 07/19/22 at 1:30pm. Pt has low iron and needs colonoscopy. Daughter aware of appt.

## 2022-06-27 ENCOUNTER — Ambulatory Visit: Payer: PPO | Admitting: Podiatry

## 2022-06-27 DIAGNOSIS — M722 Plantar fascial fibromatosis: Secondary | ICD-10-CM | POA: Diagnosis not present

## 2022-06-27 DIAGNOSIS — B353 Tinea pedis: Secondary | ICD-10-CM | POA: Diagnosis not present

## 2022-06-27 DIAGNOSIS — L299 Pruritus, unspecified: Secondary | ICD-10-CM | POA: Diagnosis not present

## 2022-06-27 MED ORDER — CICLOPIROX 0.77 % EX GEL
CUTANEOUS | 0 refills | Status: AC
Start: 2022-06-27 — End: ?

## 2022-06-27 MED ORDER — METHYLPREDNISOLONE 4 MG PO TBPK: ORAL_TABLET | ORAL | 0 refills | Status: DC

## 2022-06-28 DIAGNOSIS — M722 Plantar fascial fibromatosis: Secondary | ICD-10-CM | POA: Diagnosis not present

## 2022-06-28 MED ORDER — TRIAMCINOLONE ACETONIDE 10 MG/ML IJ SUSP
10.0000 mg | Freq: Once | INTRAMUSCULAR | Status: AC
Start: 2022-06-28 — End: 2022-06-28
  Administered 2022-06-28: 10 mg

## 2022-06-28 NOTE — Progress Notes (Signed)
Chief Complaint  Patient presents with   Follow-up    F/U plantar fasciitis left foot. Patient was given a steroid injection with some relief. Patient is still experiencing pain.    Tinea Pedis    Possible athletes foot. When foot gets moist patient complains of itchiness, no skin peeling.     HPI: 77 y.o. male presenting today for f/u of pain in the bottom of the left heel.  He saw Dr. Ralene Cork on 05/30/22 where xrays were obtained, and he received Rx meloxicam and a cortisone injection to the heel.  Notes improvement.  Also has secondary c/o itching to the bottom of the feet and near the toes.  It's worse when he takes off his shoes and the skin was moist, then dries and is itchy.    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  01/2010 LHC with minimal nonobstructive CAD b.qualifier: Diagnosis of  By: Manson Passey, RN, BSN, Lauren   BPH (benign prostatic hyperplasia)    Essential (primary) hypertension    Mixed hyperlipidemia    Has expereinced some intolerance to statins in the past   Pericarditis     No past surgical history on file.  No Known Allergies   Physical Exam: General: The patient is alert and oriented x3 in no acute distress.  Dermatology:  No ecchymosis, erythema, or edema bilateral.  No open lesions.  There is mild dryness to the skin of plantar foot bilateral,  but no erythema today.  No vesicles.  Vascular: Palpable pedal pulses bilaterally. Capillary refill within normal limits.  No appreciable edema.    Neurological: Light touch sensation intact bilateral.  MMT 5/5 to lower extremity bilateral. Negative Tinel's sign with percussion of the posterior tibial nerve on the affected extremity.    Musculoskeletal Exam:  There is pain on palpation of the plantarmedial & plantarcentral aspect of left heel.  No gaps or nodules within the plantar fascia.  Positive Windlass mechanism bilateral.  Antalgic gait noted with first few steps upon  standing.  No pain on palpation of achilles tendon bilateral.  Ankle df less than 10 degrees with knee extended b/l.  Assessment/Plan of Care: 1. Plantar fasciitis of left foot   2. Tinea pedis of both feet   3. Pruritus     Meds ordered this encounter  Medications   methylPREDNISolone (MEDROL DOSEPAK) 4 MG TBPK tablet    Sig: Take as directed    Dispense:  21 tablet    Refill:  0   Ciclopirox 0.77 % gel    Sig: Apply to both feet and between toes bid for 4 weeks.    Dispense:  45 g    Refill:  0   triamcinolone acetonide (KENALOG) 10 MG/ML injection 10 mg   FOR HOME USE ONLY DME NIGHT SPLINT  -Reviewed etiology of plantar fasciitis with patient.  Discussed treatment options with patient today, including cortisone injection, NSAID course of treatment, stretching exercises, physical therapy, use of night splint, rest, icing the heel, arch supports/orthotics, and supportive shoe gear.    With patient's consent a 2nd cortisone injection was administered to the plantar left heel, using a medial approach.  This consisted of a mixture of 1% lidocaine plain, 0.5% bupivocaine plain, and Kenalog 10 for total of 1.5 cc's.  He tolerated this well.  Bandaid applied.  He was fitted for a prefab nightsplint / static AFO with soft inner lining material.  He'll  wear this overnight while sleeping to lessen the pain with first steps out of bed in the morning.    Patient provided stretching exercises / education for plantar fasciitis today.  Rx Loprox gel for BID application for skin fungus.  Return in about 4 weeks (around 07/25/2022) for heel pain recheck.   Clerance Lav, DPM, FACFAS [facility]     [address] [place], [place] [address]                Office [phone]  Fax [phone]

## 2022-07-19 ENCOUNTER — Ambulatory Visit (INDEPENDENT_AMBULATORY_CARE_PROVIDER_SITE_OTHER): Payer: PPO | Admitting: Nurse Practitioner

## 2022-07-19 ENCOUNTER — Encounter: Payer: Self-pay | Admitting: Nurse Practitioner

## 2022-07-19 ENCOUNTER — Other Ambulatory Visit (INDEPENDENT_AMBULATORY_CARE_PROVIDER_SITE_OTHER): Payer: PPO

## 2022-07-19 VITALS — BP 120/70 | HR 74 | Ht 69.0 in | Wt 209.0 lb

## 2022-07-19 DIAGNOSIS — E611 Iron deficiency: Secondary | ICD-10-CM

## 2022-07-19 DIAGNOSIS — K219 Gastro-esophageal reflux disease without esophagitis: Secondary | ICD-10-CM

## 2022-07-19 DIAGNOSIS — Z86010 Personal history of colonic polyps: Secondary | ICD-10-CM

## 2022-07-19 LAB — IBC + FERRITIN
Ferritin: 79 ng/mL (ref 22.0–322.0)
Iron: 78 ug/dL (ref 42–165)
Saturation Ratios: 24 % (ref 20.0–50.0)
TIBC: 324.8 ug/dL (ref 250.0–450.0)
Transferrin: 232 mg/dL (ref 212.0–360.0)

## 2022-07-19 LAB — B12 AND FOLATE PANEL
Folate: 12.6 ng/mL (ref >5.9)
Vitamin B-12: 580 pg/mL (ref 211–911)

## 2022-07-19 LAB — CBC
HCT: 44.8 % (ref 39.0–52.0)
Hemoglobin: 14.6 g/dL (ref 13.0–17.0)
MCHC: 32.5 g/dL (ref 30.0–36.0)
MCV: 89.6 fl (ref 78.0–100.0)
Platelets: 248 10*3/uL (ref 150.0–400.0)
RBC: 5 Mil/uL (ref 4.22–5.81)
RDW: 16.6 % — ABNORMAL HIGH (ref 11.5–15.5)
WBC: 8.7 10*3/uL (ref 4.0–10.5)

## 2022-07-19 MED ORDER — NA SULFATE-K SULFATE-MG SULF 17.5-3.13-1.6 GM/177ML PO SOLN: 1.0000 | Freq: Once | ORAL | 0 refills | Status: AC

## 2022-07-19 NOTE — Progress Notes (Signed)
07/19/2022 Randy Ramos [id number] [birth year]   CHIEF COMPLAINT: Low ferritin level   HISTORY OF PRESENT ILLNESS: Randy Ramos is a 77 year old Ramos with a past medical history of Ramos, Randy Ramos, Randy Ramos, Randy Ramos, Randy Ramos, Randy Ramos, Randy Ramos, Randy Ramos, Randy Ramos.   He presents to our office today as referred by Dr. Willis Modena  and Dr. Candise Che for further evaluation regarding low ferritin levels with mild anemia Hg 13.2 (14 - 17.5) and ferritin 5 which was identified during his recent work up for Randy Ramos. Stool heme cards x Ramos were negative. He denies having any nausea or vomiting. No upper or lower abdominal pain but stated his stomach has felt sensitive for a while. No recent heartburn of dysphagia He describes having a vague tickle discomfort in his esophagus which is intermittent, no specific triggers. He endorsed undergoing an EGD by Dr. Charm Barges 05/31/2021 which showed showed a normal esophagus, stomach and duodenum. Gastric biopsies showed reflux esophagitis, duodenal biopsies were not done. He was prescribed Protonix 40mg  bid and Famotidine 20mg  bid. Since he was found to have iron deficiency, Protonix was reduced to once daily and Famotidine PRN as advised by Dr. Sherral Hammers. He is passing normal Sawdey stools daily. No rectal bleeding or black stools. His EGD records are not available for review at this time. He underwent a colonoscopy by Dr. Charm Barges 02/02/2015 which identified a large 12mm sessile serrated polyp removed from the ascending colon and a copious amount of liquid stool was noted in the right colon which was washed out. A repeat colonoscopy with a double bowel prep was done 02/04/2019 which was normal, no Ramos and the prep was good. He was advised to repeat a colonoscopy in 5 years. He received a B12 injection about one week ago to boost his energy.  He eats a healthy diet. No  history of intestinal surgeries or IBD. On ASA 81mg  daily, no other NSAID use. He took oral iron x 2 weeks then stopped it due to stomach upset. He received IV iron x 1 on 5/Ramos/2024. Father had throat cancer. No family history of esophageal, gastric or colon cancer.   Labs Ramos/29/2024: Iron 124??? Ferritin 5. TIBC 460. WBC 6.90. Hg 13.2. Hct 39.Ramos. MCV 85.5.  Labs Ramos/26/2024: Iron 21. Iron saturation 5%. TIBC 396. Ferritin 11.  B12 level 668. Folate 9.7.      Latest Ref Rng & Units 03/31/2020    7:30 PM 01/31/2010   10:58 AM 12/21/2009    8:14 AM  CBC  WBC 4.0 - 10.5 K/uL 14.7  7.0    Hemoglobin 13.0 - 17.0 g/dL 40.9  81.1  91.4   Hematocrit 39.0 - 52.0 % 44.4  40.8    Platelets 150 - 400 K/uL 273  208.0      CARDIAC STUDIES:  Cardiac CTA 05/25/21: IMPRESSION: 1. Coronary calcium score of 71.4. This was 28th percentile for age-, sex, and race-matched controls.   2. Normal coronary origin with left dominance.   Ramos. Mild calcified plaque (25-49%) in the mid LAD.   RECOMMENDATIONS: 1. Mild non-obstructive CAD (25-49%). Consider non-atherosclerotic causes of chest pain. Consider preventive therapy and risk factor modification.   Echo Ramos/1/22:  1. Left ventricular ejection fraction, by estimation, is 60 to 65%. Left  ventricular ejection fraction by PLAX is 59 %. The left ventricle has  normal function. The left ventricle has no regional wall motion  abnormalities. There is mild  concentric left  ventricular hypertrophy. Left ventricular diastolic parameters are  consistent with Grade I diastolic dysfunction (impaired relaxation).  Elevated left ventricular end-diastolic pressure. The average left  ventricular global longitudinal strain is -20.5 %.  The global longitudinal strain is normal.   2. Right ventricular systolic function is normal. The right ventricular  size is normal. There is normal pulmonary artery systolic pressure.   Ramos. The mitral valve is normal in structure. No  evidence of mitral valve  regurgitation. No evidence of mitral stenosis.   4. The aortic valve is tricuspid. Aortic valve regurgitation is not  visualized. No aortic stenosis is present.   5. The inferior vena cava is normal in size with greater than 50%  respiratory variability, suggesting right atrial pressure of Ramos mmHg.    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  01/2010 LHC with minimal Randy CAD b.qualifier: Diagnosis of  By: Manson Passey, RN, BSN, Lauren   Randy Ramos (benign prostatic hyperplasia)    Essential (primary) Ramos    Mixed Randy Ramos    Has expereinced some intolerance to statins in the past   Randy    No past surgical history on file.  Social History: He is married. Non-smoker.  No alcohol use.  No drug use.  Family History: Father with history of throat cancer, chewed tobacco and Ramos.  Mother with history of Ramos.  Sister with Randy Ramos.   No Known Allergies    Outpatient Encounter Medications as of 07/19/2022  Medication Sig   aspirin EC 81 MG tablet Take 1 tablet (81 mg total) by mouth daily. Swallow whole.   chlorhexidine (PERIDEX) 0.12 % solution PRN   Ciclopirox 0.77 % gel Apply to both feet and between toes bid for 4 weeks.   Coenzyme Q10-Vitamin E (QUNOL ULTRA COQ10 PO) Take 1 capsule by mouth daily. (Patient not taking: Reported on 05/25/2022)   ezetimibe (ZETIA) 10 MG tablet Take 10 mg by mouth daily.   famotidine (PEPCID) 40 MG tablet Take 1 tablet by mouth 2 (two) times daily.   finasteride (PROSCAR) 5 MG tablet Take 5 mg by mouth daily.   fluticasone (FLONASE) 50 MCG/ACT nasal spray Place 1 spray into both nostrils as needed.   lisinopril (ZESTRIL) 5 MG tablet Take 5 mg by mouth daily.   meloxicam (MOBIC) 15 MG tablet Take 1 tablet (15 mg total) by mouth daily.   methylPREDNISolone (MEDROL DOSEPAK) 4 MG TBPK tablet Take as directed   metoprolol tartrate (LOPRESSOR) 50 MG  tablet Take one tablet two hours prior to cardiac CTA. (Patient not taking: Reported on 05/25/2022)   ondansetron (ZOFRAN-ODT) 4 MG disintegrating tablet Take by mouth as needed.   pantoprazole (PROTONIX) 40 MG tablet Take 40 mg by mouth daily.   pramipexole (MIRAPEX) 0.125 MG tablet Take 1-4 tablets by mouth at bedtime. (Patient not taking: Reported on 05/25/2022)   rOPINIRole (REQUIP) 2 MG tablet Take by mouth.   rosuvastatin (CRESTOR) 10 MG tablet Take 1 tablet (10 mg total) by mouth daily. (Patient not taking: Reported on 05/25/2022)   sertraline (ZOLOFT) 50 MG tablet Take 1 tablet by mouth daily. (Patient not taking: Reported on 05/25/2022)   tamsulosin (FLOMAX) 0.4 MG CAPS capsule Take 0.8 mg by mouth at bedtime.   traMADol (ULTRAM) 50 MG tablet Take by mouth.   No facility-administered encounter medications on file as of 07/19/2022.    REVIEW OF SYSTEMS:  Gen: + Fatigue. Denies fever, sweats or chills.  No weight loss.  CV: Denies chest pain, palpitations or edema. Resp: Denies cough, shortness of breath of hemoptysis.  GI: See HPI.  GU : Denies urinary burning, blood in urine, increased urinary frequency or incontinence. MS: + Randy Ramos.  Derm: Denies rash, itchiness, skin lesions or unhealing ulcers. Psych: Denies depression, anxiety, memory loss or confusion. Heme: Denies bruising, easy bleeding. Neuro:  Denies headaches, dizziness or paresthesias. Endo:  Denies any problems with DM, thyroid or adrenal function.  PHYSICAL EXAM: BP 120/70   Pulse 74   Ht 5\' 9"  (1.753 m)   Wt 209 lb (94.8 kg)   BMI 30.86 kg/m   Wt Readings from Last Ramos Encounters:  07/19/22 209 lb (94.8 kg)  05/25/22 208 lb 14.4 oz (94.8 kg)  05/01/22 206 lb 6.4 oz (93.6 kg)    General: 77 year old Ramos in no acute distress. Head: Normocephalic and atraumatic. Eyes:  Sclerae non-icteric, conjunctive pink. Ears: Normal auditory acuity. Mouth: Dentition intact. No ulcers or lesions.  Neck:  Supple, no lymphadenopathy or thyromegaly.  Lungs: Clear bilaterally to auscultation without wheezes, crackles or rhonchi. Heart: Regular rate and rhythm. No murmur, rub or gallop appreciated.  Abdomen: Soft, nontender, nondistended. No masses. No hepatosplenomegaly. Normoactive bowel sounds x 4 quadrants.  Rectal: Deferred.  Musculoskeletal: Symmetrical with no gross deformities. Skin: Warm and dry. No rash or lesions on visible extremities. Extremities: No edema. Neurological: Alert oriented x 4, no focal deficits.  Psychological:  Alert and cooperative. Normal mood and affect.  ASSESSMENT AND PLAN:  77 year old Ramos with significant iron deficiency with mild anemia. No overt GI bleeding. Stool cards negative for occult blood. Intolerant to oral iron. Received IV iron x 1.  EGD 05/2021 showed reflux esophagitis. Colonoscopy 2016 showed a large sessile serrated polyp and colonoscopy 01/2019 was normal.  -EGD and colonoscopy benefits and risks discussed including risk with sedation, risk of bleeding, perforation and infection.  -2 day bowel prep -CBC and iron panel  -Continue follow up with hematology   History of Randy. EGD 05/2021 showed reflux esophagitis treated with PPI and H2 blocker bid. Pantoprazole 40mg  was reduced to QD a few months ago and Famotidine PRN. Patient has minor esophageal irritation/tickle like sensation at times otherwise no active reflux symptoms.  -Continue Pantoprazole 40mg  QD and Famotidine PRN for now -EGD as ordered above   History of a large sessile serrated colon polyp removed from the colon in 2016. Repeat colonoscopy 01/2019 with a 2 day prep was normal.  -Colonoscopy as ordered above due to IDA  Randy CAD per CT, no chest pain        CC:  Hadley Pen, MD

## 2022-07-19 NOTE — Patient Instructions (Addendum)
You have been scheduled for an endoscopy and colonoscopy. Please follow the written instructions given to you at your visit today. Please pick up your prep supplies at the pharmacy within the next 1-3 days. If you use inhalers (even only as needed), please bring them with you on the day of your procedure.  Your provider has requested that you go to the basement level for lab work before leaving today. Press "B" on the elevator. The lab is located at the first door on the left as you exit the elevator.  Due to recent changes in healthcare laws, you may see the results of your imaging and laboratory studies on MyChart before your provider has had a chance to review them.  We understand that in some cases there may be results that are confusing or concerning to you. Not all laboratory results come back in the same time frame and the provider may be waiting for multiple results in order to interpret others.  Please give us 48 hours in order for your provider to thoroughly review all the results before contacting the office for clarification of your results.   Thank you for trusting me with your gastrointestinal care!   Colleen Kennedy-Smith, CRNP   

## 2022-07-25 ENCOUNTER — Encounter (INDEPENDENT_AMBULATORY_CARE_PROVIDER_SITE_OTHER): Payer: PPO | Admitting: Podiatry

## 2022-07-25 DIAGNOSIS — Z91199 Patient's noncompliance with other medical treatment and regimen due to unspecified reason: Secondary | ICD-10-CM

## 2022-07-25 NOTE — Progress Notes (Signed)
Addendum: Reviewed and agree with assessment and management plan. Agree with plan for upper and lower endoscopy Deeanne Deininger, Carie Caddy, MD

## 2022-07-25 NOTE — Progress Notes (Signed)
This encounter was created in error - please disregard.  Patient was a no-show for first appointment of morning.

## 2022-07-27 NOTE — Progress Notes (Signed)
Pls contact patient and let him know Dr. Rhea Belton reviewed his office consult and agreed with plan. Patient to proceed with EGD and colonoscopy as scheduled. THX.

## 2022-07-30 ENCOUNTER — Telehealth: Payer: Self-pay | Admitting: Hematology

## 2022-07-31 ENCOUNTER — Telehealth: Payer: Self-pay

## 2022-07-31 NOTE — Telephone Encounter (Signed)
Pt was made aware of of Alcide Evener NP and Dr. Rhea Belton recommendations to proceed with EGD and colonoscopy as scheduled.  Pt verbalized understanding with all questions answered.

## 2022-07-31 NOTE — Telephone Encounter (Deleted)
-----   Message from Arnaldo Natal, NP sent at 07/27/2022  4:23 PM EDT -----    ----- Message ----- From: Beverley Fiedler, MD Sent: 07/25/2022   4:52 PM EDT To: Arnaldo Natal, NP     ----- Message ----- From: Arnaldo Natal, NP Sent: 07/20/2022   5:44 AM EDT To: Beverley Fiedler, MD  Dr. Rhea Belton, let me know if you agree with double procedure or if you prefer to do EGD first and if unrevealing then schedule a colonoscopy. THX.

## 2022-07-31 NOTE — Telephone Encounter (Signed)
Message Received: 4 days ago Arnaldo Natal, NP  Emeline Darling, RN      Previous Messages  Routed Note  Author: Arnaldo Natal, NP Service: Gastroenterology Author Type: Nurse Practitioner  Filed: 07/27/2022  4:24 PM Encounter Date: 07/19/2022 Status: Signed  Editor: Arnaldo Natal, NP (Nurse Practitioner)   Pls contact patient and let him know Dr. Rhea Belton reviewed his office consult and agreed with plan. Patient to proceed with EGD and colonoscopy as scheduled. THX.

## 2022-08-15 ENCOUNTER — Other Ambulatory Visit: Payer: Self-pay | Admitting: Nurse Practitioner

## 2022-08-16 ENCOUNTER — Ambulatory Visit: Payer: PPO | Admitting: Cardiovascular Disease

## 2022-08-16 ENCOUNTER — Encounter: Payer: Self-pay | Admitting: Cardiovascular Disease

## 2022-08-16 VITALS — BP 140/80 | HR 62 | Ht 69.0 in | Wt 208.0 lb

## 2022-08-16 DIAGNOSIS — I2510 Atherosclerotic heart disease of native coronary artery without angina pectoris: Secondary | ICD-10-CM | POA: Diagnosis not present

## 2022-08-16 DIAGNOSIS — I10 Essential (primary) hypertension: Secondary | ICD-10-CM | POA: Diagnosis not present

## 2022-08-16 DIAGNOSIS — E782 Mixed hyperlipidemia: Secondary | ICD-10-CM

## 2022-08-16 MED ORDER — LISINOPRIL 10 MG PO TABS: 10.0000 mg | ORAL_TABLET | Freq: Every day | ORAL | 3 refills | Status: DC

## 2022-08-16 NOTE — Progress Notes (Signed)
Cardiology Office Note:    Date:  08/16/2022   ID:  Randy Ramos, DOB 1945-09-15, MRN [id number]  PCP:  Hadley Pen, MD   [facility] Providers Cardiologist:  Tonny Bollman, MD     Referring MD: Hadley Pen, MD   Chief Complaint  Patient presents with   Hypertension    History of Present Illness:    Randy Ramos is a 77 y.o. male presenting for follow-up of HTN and hyperlipidemia. He also has a hx of nonobstructive CAD and pericarditis.   The patient is here alone today. He has a lot of problems with restless leg syndrome and reports that this has really impacted his lifestyle. He has significant fatigue as a side effect of the medication. It's very difficult for him to sit still to read or stand still. We talked about how much he has enjoyed watching his granddaughters play high school sports. Today, he denies symptoms of palpitations, chest pain, shortness of breath, orthopnea, PND, lower extremity edema, dizziness, or syncope. He's active, likes to work outside.    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  01/2010 LHC with minimal nonobstructive CAD b.qualifier: Diagnosis of  By: Manson Passey, RN, BSN, Lauren   BPH (benign prostatic hyperplasia)    Essential (primary) hypertension    Mixed hyperlipidemia    Has expereinced some intolerance to statins in the past   Pericarditis     History reviewed. No pertinent surgical history.  Current Medications: Current Meds  Medication Sig   chlorhexidine (PERIDEX) 0.12 % solution PRN   Coenzyme Q10-Vitamin E (QUNOL ULTRA COQ10 PO) Take 1 capsule by mouth daily.   ezetimibe (ZETIA) 10 MG tablet Take 10 mg by mouth daily.   famotidine (PEPCID) 40 MG tablet Take 1 tablet by mouth 2 (two) times daily.   finasteride (PROSCAR) 5 MG tablet Take 5 mg by mouth daily.   fluticasone (FLONASE) 50 MCG/ACT nasal spray Place 1 spray into both nostrils as needed.   lisinopril (ZESTRIL) 10 MG  tablet Take 1 tablet (10 mg total) by mouth daily.   pantoprazole (PROTONIX) 40 MG tablet Take 40 mg by mouth daily.   rOPINIRole (REQUIP) 2 MG tablet Take by mouth.   tamsulosin (FLOMAX) 0.4 MG CAPS capsule Take 0.8 mg by mouth at bedtime.   [DISCONTINUED] aspirin EC 81 MG tablet Take 1 tablet (81 mg total) by mouth daily. Swallow whole.   [DISCONTINUED] Ciclopirox 0.77 % gel Apply to both feet and between toes bid for 4 weeks.   [DISCONTINUED] lisinopril (ZESTRIL) 5 MG tablet Take 5 mg by mouth daily.   [DISCONTINUED] ondansetron (ZOFRAN-ODT) 4 MG disintegrating tablet Take by mouth as needed.   [DISCONTINUED] traMADol (ULTRAM) 50 MG tablet Take by mouth.     Allergies:   Patient has no known allergies.   Social History   Socioeconomic History   Marital status: Married    Spouse name: Not on file   Number of children: Not on file   Years of education: Not on file   Highest education level: Not on file  Occupational History   Not on file  Tobacco Use   Smoking status: Never   Smokeless tobacco: Former  Advertising account planner   Vaping status: Never Used  Substance and Sexual Activity   Alcohol use: No   Drug use: Never   Sexual activity: Not on file  Other Topics Concern   Not on file  Social  History Narrative   Not on file   Social Determinants of Health   Financial Resource Strain: Not on file  Food Insecurity: Not on file  Transportation Needs: Not on file  Physical Activity: Not on file  Stress: Not on file  Social Connections: Not on file     Family History: The patient's family history includes Hypertension in his father and mother; Restless legs syndrome in his sister.  ROS:   Please see the history of present illness.    All other systems reviewed and are negative.  EKGs/Labs/Other Studies Reviewed:    The following studies were reviewed today: Cardiac Studies & Procedures     STRESS TESTS  ECHOCARDIOGRAM STRESS TEST 02/13/2017  Narrative *[facility] [address] [place], [place] [address] [phone]  ------------------------------------------------------------------- Stress Echocardiography  Patient:    Randy Ramos MR #:       [id number] Study Date: 02/13/2017 Gender:     M Age:        78 Height:     175.3 cm Weight:     94 kg BSA:        2.17 m^2 Pt. Status: Room:  ATTENDING    Tonny Bollman, MD ORDERING     Tonny Bollman, MD REFERRING    Tonny Bollman, MD SONOGRAPHER  Clearence Ped, RCS PERFORMING   [facility] Outpatient  cc: Dr. Excell Seltzer  -------------------------------------------------------------------  ------------------------------------------------------------------- Indications:      SOB (R06.02).  ------------------------------------------------------------------- History:   PMH:  Hyperlipidemia.  Coronary artery disease.  Risk factors:  Hypertension.  ------------------------------------------------------------------- Study Conclusions  - Left ventricle: Mild LVH. Normal LVEF 55-60% at baseline. Normal wall motion. The study is not technically sufficient to allow evaluation of LV diastolic function. - Aortic valve: Structurally normal valve. Trileaflet. - Mitral valve: Structurally normal valve. - Left atrium: The atrium was mildly dilated. - Right ventricle: The cavity size was normal. Wall thickness was normal. Systolic function was normal. - Right atrium: The atrium was normal in size. - Baseline ECG: Normal sinus rhythm. - Stress ECG conclusions: Sinus tachycardia without ischemic changes. - Baseline: LVEF 55-60%, normal wall thickening and motion. - Peak stress: Expected hyperdynamic increase in LVEF to 75-80%, normal wall motion and thickening. - Recovery: LVEF 60-65%, normal wall motion and thickening.  Impressions:  - No exercise-induced EKG or echocardiographic changes to suggest ischemia at given  workload.  ------------------------------------------------------------------- Study data:   Study status:  Routine.  Consent:  The risks, benefits, and alternatives to the procedure were explained to the patient and informed consent was obtained.  Procedure:  The patient reported no pain pre or post test. Initial setup. The patient was brought to the laboratory. A baseline ECG was recorded. Surface ECG leads and automatic cuff blood pressure measurements were monitored. Treadmill exercise testing was performed using the Bruce protocol. The patient exercised for 9.3 min, to protocol stage 3, to a maximal work rate of 10.9 mets. Exercise was terminated due to achievement of target heart rate. The patient was positioned for image acquisition and recovery monitoring. Transthoracic stress echocardiography for SOB. Image quality was adequate. Images were captured at baseline and peak exercise.  Study completion:  The patient tolerated the procedure well. There were no complications. Bruce protocol. Stress echocardiography.  Birthdate: Patient birthdate: 1945/07/11.  Age:  Patient is 77 yr old.  Sex: Gender: male.    BMI: 30.6 kg/m^2.  Blood pressure:     144/79 Patient status:  Outpatient.  Study date:  Study date: 02/13/2017. Study time: 07:41 AM.  -------------------------------------------------------------------  ------------------------------------------------------------------- Left ventricle:  Mild LVH. Normal LVEF 55-60% at baseline. Normal wall motion. The study is not technically sufficient to allow evaluation of LV diastolic function.  ------------------------------------------------------------------- Aortic valve:   Structurally normal valve. Trileaflet. Cusp separation was normal.  Doppler:  Transvalvular velocity was within the normal range. There was no stenosis. There was no regurgitation.    VTI ratio of LVOT to aortic valve: 0.99. Peak velocity ratio of LVOT to aortic  valve: 0.84. Mean velocity ratio of LVOT to aortic valve: 0.96.    Mean gradient (S): 2 mm Hg. Peak gradient (S): 6 mm Hg.  ------------------------------------------------------------------- Mitral valve:   Structurally normal valve.   Leaflet separation was normal.  Doppler:  Transvalvular velocity was within the normal range. There was no evidence for stenosis. There was no regurgitation.  ------------------------------------------------------------------- Left atrium:  The atrium was mildly dilated.  ------------------------------------------------------------------- Atrial septum:  Poorly visualized.  ------------------------------------------------------------------- Right ventricle:  The cavity size was normal. Wall thickness was normal. Systolic function was normal.  ------------------------------------------------------------------- Right atrium:  The atrium was normal in size.  ------------------------------------------------------------------- Baseline ECG:   Normal sinus rhythm.  ------------------------------------------------------------------- Stress protocol:  +--------------------+---+-----------+--------+-------------------+ !Stage               !HR !BP (mmHg)  !Symptoms!Comments           ! +--------------------+---+-----------+--------+-------------------+ !Baseline            !63 !144/79     !None    !-------------------! !                    !   !(101)      !        !                   ! +--------------------+---+-----------+--------+-------------------+ !Stage 1             !89 !152/56 (88)!--------!RPE = 18, R/T leg  ! !                    !   !           !        !Fatigue.           ! +--------------------+---+-----------+--------+-------------------+ !Stage 2             !100!175/60 (98)!--------!-------------------! +--------------------+---+-----------+--------+-------------------+ !Stage 3             !811!914/78      !--------!-------------------! !                    !   !(121)      !        !                   ! +--------------------+---+-----------+--------+-------------------+ !Immediate post      !134!-----------!--------!-------------------! !stress              !   !           !        !                   ! +--------------------+---+-----------+--------+-------------------+ !Recovery; 1 min     !91 !206/41 (96)!--------!-------------------! +--------------------+---+-----------+--------+-------------------+ !Recovery; 2 min     !85 !-----------!--------!-------------------! +--------------------+---+-----------+--------+-------------------+ !Recovery; 3 min     !78 !191/52 (98)!--------!-------------------! +--------------------+---+-----------+--------+-------------------+ !Recovery; 4 min     !  77 !-----------!--------!-------------------! +--------------------+---+-----------+--------+-------------------+ !Recovery; 5 min     !88 !129/44 (72)!--------!-------------------! +--------------------+---+-----------+--------+-------------------+  ------------------------------------------------------------------- Stress results:   Maximal heart rate during stress was 134 bpm (90% of maximal predicted heart rate). The maximal predicted heart rate was 149 bpm.The target heart rate was achieved. The heart rate response to stress was normal. There was a normal resting blood pressure with a hypertensive response to stress. The rate-pressure product for the peak heart rate and blood pressure was 16109 mm Hg/min.  ------------------------------------------------------------------- Stress ECG:  Sinus tachycardia without ischemic changes.  ------------------------------------------------------------------- Baseline:  Peak stress: Recovery:  ------------------------------------------------------------------- Measurements  LVOT                              Value LVOT peak velocity, S             103    cm/s LVOT mean velocity, S             67.5  cm/s LVOT VTI, S                       21.9  cm  Aortic valve                      Value Aortic valve peak velocity, S     122   cm/s Aortic valve mean velocity, S     70.4  cm/s Aortic valve VTI, S               22.2  cm Aortic mean gradient, S           2     mm Hg Aortic peak gradient, S           6     mm Hg VTI ratio, LVOT/AV                0.99 Velocity ratio, peak, LVOT/AV     0.84 Velocity ratio, mean, LVOT/AV     0.96  Legend: (L)  and  (H)  mark values outside specified reference range.  ------------------------------------------------------------------- Prepared and Electronically Authenticated by  Zoila Shutter MD 2019-01-09T17:11:27   ECHOCARDIOGRAM  ECHOCARDIOGRAM COMPLETE 04/05/2020  Narrative ECHOCARDIOGRAM REPORT    Patient Name:   Randy Ramos Date of Exam: 04/05/2020 Medical Rec #:  [id number]     Height:       69.0 in Accession #:    [id number]    Weight:       189.0 lb Date of Birth:  09/13/1945     BSA:          2.017 m Patient Age:    74 years      BP:           120/66 mmHg Patient Gender: M             HR:           63 bpm. Exam Location:  [address]  Procedure: 2D Echo, 3D Echo, Cardiac Doppler, Color Doppler and Strain Analysis  Indications:    R07.9 Chest pain  History:        Patient has prior history of Echocardiogram examinations. CAD; Risk Factors:Dyslipidemia.  Sonographer:    Garald Braver, RDCS Referring Phys: [id number] Jerrol Helmers  IMPRESSIONS   1. Left ventricular ejection fraction, by estimation, is 60 to 65%. Left ventricular ejection fraction by PLAX is 59 %. The left ventricle  has normal function. The left ventricle has no regional wall motion abnormalities. There is mild concentric left ventricular hypertrophy. Left ventricular diastolic parameters are consistent with Grade I diastolic dysfunction (impaired relaxation). Elevated left ventricular end-diastolic pressure. The  average left ventricular global longitudinal strain is -20.5 %. The global longitudinal strain is normal. 2. Right ventricular systolic function is normal. The right ventricular size is normal. There is normal pulmonary artery systolic pressure. 3. The mitral valve is normal in structure. No evidence of mitral valve regurgitation. No evidence of mitral stenosis. 4. The aortic valve is tricuspid. Aortic valve regurgitation is not visualized. No aortic stenosis is present. 5. The inferior vena cava is normal in size with greater than 50% respiratory variability, suggesting right atrial pressure of 3 mmHg.  FINDINGS Left Ventricle: Left ventricular ejection fraction, by estimation, is 60 to 65%. Left ventricular ejection fraction by PLAX is 59 %. The left ventricle has normal function. The left ventricle has no regional wall motion abnormalities. The average left ventricular global longitudinal strain is -20.5 %. The global longitudinal strain is normal. The left ventricular internal cavity size was normal in size. There is mild concentric left ventricular hypertrophy. Left ventricular diastolic parameters are consistent with Grade I diastolic dysfunction (impaired relaxation). Elevated left ventricular end-diastolic pressure.  Right Ventricle: The right ventricular size is normal. No increase in right ventricular wall thickness. Right ventricular systolic function is normal. There is normal pulmonary artery systolic pressure. The tricuspid regurgitant velocity is 1.56 m/s, and with an assumed right atrial pressure of 3 mmHg, the estimated right ventricular systolic pressure is 12.7 mmHg.  Left Atrium: Left atrial size was normal in size.  Right Atrium: Right atrial size was normal in size.  Pericardium: There is no evidence of pericardial effusion.  Mitral Valve: The mitral valve is normal in structure. No evidence of mitral valve regurgitation. No evidence of mitral valve stenosis.  Tricuspid  Valve: The tricuspid valve is normal in structure. Tricuspid valve regurgitation is trivial. No evidence of tricuspid stenosis.  Aortic Valve: The aortic valve is tricuspid. Aortic valve regurgitation is not visualized. No aortic stenosis is present.  Pulmonic Valve: The pulmonic valve was normal in structure. Pulmonic valve regurgitation is not visualized. No evidence of pulmonic stenosis.  Aorta: The aortic root is normal in size and structure.  Venous: The inferior vena cava is normal in size with greater than 50% respiratory variability, suggesting right atrial pressure of 3 mmHg.  IAS/Shunts: No atrial level shunt detected by color flow Doppler.   LEFT VENTRICLE PLAX 2D LV EF:         Left            Diastology ventricular     LV e' medial:    5.78 cm/s ejection        LV E/e' medial:  13.9 fraction by     LV e' lateral:   7.58 cm/s PLAX is 59      LV E/e' lateral: 10.6 %. LVIDd:         4.20 cm         2D LVIDs:         2.90 cm         Longitudinal LV PW:         1.10 cm         Strain LV IVS:        1.10 cm         2D Strain GLS  -20.4 %  LVOT diam:     2.10 cm         (A2C): LV SV:         81              2D Strain GLS  -21.3 % LV SV Index:   40              (A3C): LVOT Area:     3.46 cm        2D Strain GLS  -19.6 % (A4C): 2D Strain GLS  -20.5 % Avg:  3D Volume EF: 3D EF:        60 % LV EDV:       164 ml LV ESV:       66 ml LV SV:        98 ml  RIGHT VENTRICLE RV Basal diam:  2.70 cm RV S prime:     12.80 cm/s TAPSE (M-mode): 2.6 cm RVSP:           12.7 mmHg  LEFT ATRIUM             Index       RIGHT ATRIUM           Index LA diam:        3.70 cm 1.83 cm/m  RA Pressure: 3.00 mmHg LA Vol (A2C):   39.1 ml 19.39 ml/m RA Area:     10.90 cm LA Vol (A4C):   29.0 ml 14.38 ml/m RA Volume:   20.40 ml  10.11 ml/m LA Biplane Vol: 33.5 ml 16.61 ml/m AORTIC VALVE LVOT Vmax:   106.00 cm/s LVOT Vmean:  70.400 cm/s LVOT VTI:    0.233 m  AORTA Ao Root diam: 3.50  cm Ao Asc diam:  3.20 cm  MITRAL VALVE               TRICUSPID VALVE TR Peak grad:   9.7 mmHg TR Vmax:        156.00 cm/s MV E velocity: 80.10 cm/s  Estimated RAP:  3.00 mmHg MV A velocity: 82.70 cm/s  RVSP:           12.7 mmHg MV E/A ratio:  0.97 SHUNTS Systemic VTI:  0.23 m Systemic Diam: 2.10 cm  Chilton Si MD Electronically signed by Chilton Si MD Signature Date/Time: 04/05/2020/6:51:36 PM    Final     CT SCANS  CT CORONARY MORPH W/CTA COR W/SCORE 05/25/2021  Addendum 05/25/2021  1:05 PM ADDENDUM REPORT: 05/25/2021 13:02  CLINICAL DATA:  Chest pain  EXAM: Cardiac/Coronary CTA  TECHNIQUE: A non-contrast, gated CT scan was obtained with axial slices of 3 mm through the heart for calcium scoring. Calcium scoring was performed using the Agatston method. A 120 kV prospective, gated, contrast cardiac scan was obtained. Gantry rotation speed was 250 msecs and collimation was 0.6 mm. Two sublingual nitroglycerin tablets (0.8 mg) were given. The 3D data set was reconstructed in 5% intervals of the 35-75% of the R-R cycle. Diastolic phases were analyzed on a dedicated workstation using MPR, MIP, and VRT modes. The patient received 95 cc of contrast.  FINDINGS: Image quality: Excellent.  Noise artifact is: Limited.  Coronary Arteries:  Normal coronary origin.  Left dominance.  Left main: The left main is a large caliber vessel with a normal take off from the left coronary cusp that bifurcates to form a left anterior descending artery and a left circumflex artery. There is no plaque or stenosis.  Left anterior descending  artery: The proximal LAD contains minimal calcified plaque (<25%). The mid LAD contains mild calcified plaque (25-49%). The distal LAD is patent. The LAD gives off 2 patent diagonal branches.  Left circumflex artery: The LCX is dominant and patent with no evidence of plaque or stenosis. The LCX gives off 3 patent obtuse marginal  branches. The LCX terminates as a patent PDA.  Right coronary artery: The RCA is non-dominant with normal take off from the right coronary cusp. There is no evidence of plaque or stenosis.  Right Atrium: Right atrial size is within normal limits.  Right Ventricle: The right ventricular cavity is within normal limits.  Left Atrium: Left atrial size is normal in size with no left atrial appendage filling defect.  Left Ventricle: The ventricular cavity size is within normal limits.  Pulmonary arteries: Normal in size without proximal filling defect.  Pulmonary veins: Normal pulmonary venous drainage.  Pericardium: Normal thickness without significant effusion or calcium present.  Cardiac valves: The aortic valve is trileaflet without significant calcification. The mitral valve is normal without significant calcification.  Aorta: Normal caliber without significant disease.  Extra-cardiac findings: See attached radiology report for non-cardiac structures.  IMPRESSION: 1. Coronary calcium score of 71.4. This was 28th percentile for age-, sex, and race-matched controls.  2. Normal coronary origin with left dominance.  3. Mild calcified plaque (25-49%) in the mid LAD.  RECOMMENDATIONS: 1. Mild non-obstructive CAD (25-49%). Consider non-atherosclerotic causes of chest pain. Consider preventive therapy and risk factor modification.  Lennie Odor, MD   Electronically Signed By: Lennie Odor M.D. On: 05/25/2021 13:02  Narrative EXAM: OVER-READ INTERPRETATION  CT CHEST  The following report is an over-read performed by radiologist Dr. Trudie Reed of [facility] on 05/25/2021. This over-read does not include interpretation of cardiac or coronary anatomy or pathology. The coronary calcium score/coronary CTA interpretation by the cardiologist is attached.  COMPARISON:  None.  FINDINGS: Atherosclerotic calcifications in the thoracic aorta. Within  the visualized portions of the thorax there are no suspicious appearing pulmonary nodules or masses, there is no acute consolidative airspace disease, no pleural effusions, no pneumothorax and no lymphadenopathy. Small hiatal hernia. Visualized portions of the upper abdomen are unremarkable. There are no aggressive appearing lytic or blastic lesions noted in the visualized portions of the skeleton.  IMPRESSION: 1.  Aortic Atherosclerosis (ICD10-I70.0). 2. Small hiatal hernia.  Electronically Signed: By: Trudie Reed M.D. On: 05/25/2021 09:17          EKG Interpretation Date/Time:  Thursday August 16 2022 08:57:31 EDT Ventricular Rate:  62 PR Interval:  200 QRS Duration:  88 QT Interval:  418 QTC Calculation: 424 R Axis:   39  Text Interpretation: Normal sinus rhythm Normal ECG When compared with ECG of 31-Mar-2020 20:27, PREVIOUS ECG IS PRESENT Confirmed by Tonny Bollman [phone]) on 08/16/2022 9:18:14 AM    Recent Labs: 05/01/2022: TSH 2.780 07/19/2022: Hemoglobin 14.6; Platelets 248.0  Recent Lipid Panel No results found for: "CHOL", "TRIG", "HDL", "CHOLHDL", "VLDL", "LDLCALC", "LDLDIRECT"   Risk Assessment/Calculations:            Physical Exam:    VS:  BP (!) 140/80   Pulse 62   Ht 5\' 9"  (1.753 m)   Wt 208 lb (94.3 kg)   SpO2 98%   BMI 30.72 kg/m     Wt Readings from Last 3 Encounters:  08/16/22 208 lb (94.3 kg)  07/19/22 209 lb (94.8 kg)  05/25/22 208 lb 14.4 oz (94.8 kg)  GEN:  Well nourished, well developed in no acute distress HEENT: Normal NECK: No JVD; No carotid bruits LYMPHATICS: No lymphadenopathy CARDIAC: RRR, no murmurs, rubs, gallops RESPIRATORY:  Clear to auscultation without rales, wheezing or rhonchi  ABDOMEN: Soft, non-tender, non-distended MUSCULOSKELETAL:  No edema; No deformity  SKIN: Warm and dry NEUROLOGIC:  Alert and oriented x 3 PSYCHIATRIC:  Normal affect   ASSESSMENT:    1. Essential hypertension   2. Mixed  hyperlipidemia   3. Nonobstructive atherosclerosis of coronary artery    PLAN:    In order of problems listed above:  BP ranging close to goal, but I think we should intensify his Rx. Will increase lisinopril from 5 mg to 10 mg daily. Otherwise no medication changes made today. Most recent labs reviewed - normal renal function, K 4.9.  Pt statin-intolerant. See last year's note regarding shared decision making conversation. Will continue ezetimibe.  No angina. Continue zetia/lisinopril.     Medication Adjustments/Labs and Tests Ordered: Current medicines are reviewed at length with the patient today.  Concerns regarding medicines are outlined above.  Orders Placed This Encounter  Procedures   EKG 12-Lead   Meds ordered this encounter  Medications   lisinopril (ZESTRIL) 10 MG tablet    Sig: Take 1 tablet (10 mg total) by mouth daily.    Dispense:  90 tablet    Refill:  3    Dose INCREASE    Patient Instructions  Medication Instructions:  INCREASE Lisinopril to 10mg  daily *If you need a refill on your cardiac medications before your next appointment, please call your pharmacy*   Lab Work: NONE If you have labs (blood work) drawn today and your tests are completely normal, you will receive your results only by: MyChart Message (if you have MyChart) OR A paper copy in the mail If you have any lab test that is abnormal or we need to change your treatment, we will call you to review the results.   Testing/Procedures: NONE   Follow-Up: At [facility], you and your health needs are our priority.  As part of our continuing mission to provide you with exceptional heart care, we have created designated Provider Care Teams.  These Care Teams include your primary Cardiologist (physician) and Advanced Practice Providers (APPs -  Physician Assistants and Nurse Practitioners) who all work together to provide you with the care you need, when you need it.  We recommend signing  up for the patient portal called "MyChart".  Sign up information is provided on this After Visit Summary.  MyChart is used to connect with patients for Virtual Visits (Telemedicine).  Patients are able to view lab/test results, encounter notes, upcoming appointments, etc.  Non-urgent messages can be sent to your provider as well.   To learn more about what you can do with MyChart, go to [email or web address].    Your next appointment:   1 year(s)  Provider:   Tonny Bollman, MD        Signed, Tonny Bollman, MD  08/16/2022 12:51 PM    [facility]

## 2022-08-16 NOTE — Patient Instructions (Signed)
Medication Instructions:  INCREASE Lisinopril to 10mg  daily *If you need a refill on your cardiac medications before your next appointment, please call your pharmacy*   Lab Work: NONE If you have labs (blood work) drawn today and your tests are completely normal, you will receive your results only by: MyChart Message (if you have MyChart) OR A paper copy in the mail If you have any lab test that is abnormal or we need to change your treatment, we will call you to review the results.   Testing/Procedures: NONE   Follow-Up: At [facility], you and your health needs are our priority.  As part of our continuing mission to provide you with exceptional heart care, we have created designated Provider Care Teams.  These Care Teams include your primary Cardiologist (physician) and Advanced Practice Providers (APPs -  Physician Assistants and Nurse Practitioners) who all work together to provide you with the care you need, when you need it.  We recommend signing up for the patient portal called "MyChart".  Sign up information is provided on this After Visit Summary.  MyChart is used to connect with patients for Virtual Visits (Telemedicine).  Patients are able to view lab/test results, encounter notes, upcoming appointments, etc.  Non-urgent messages can be sent to your provider as well.   To learn more about what you can do with MyChart, go to [email or web address].    Your next appointment:   1 year(s)  Provider:   Tonny Bollman, MD

## 2022-08-27 ENCOUNTER — Other Ambulatory Visit: Payer: PPO

## 2022-08-27 ENCOUNTER — Ambulatory Visit: Payer: PPO | Admitting: Hematology

## 2022-09-14 ENCOUNTER — Other Ambulatory Visit: Payer: PPO

## 2022-09-14 ENCOUNTER — Ambulatory Visit: Payer: PPO | Admitting: Hematology

## 2022-09-21 ENCOUNTER — Encounter: Payer: Self-pay | Admitting: Internal Medicine

## 2022-09-24 ENCOUNTER — Other Ambulatory Visit: Payer: Self-pay

## 2022-09-24 DIAGNOSIS — D509 Iron deficiency anemia, unspecified: Secondary | ICD-10-CM

## 2022-09-25 ENCOUNTER — Inpatient Hospital Stay: Payer: PPO | Attending: Hematology

## 2022-09-25 ENCOUNTER — Inpatient Hospital Stay: Payer: PPO | Admitting: Hematology

## 2022-09-25 VITALS — BP 147/57 | HR 69 | Temp 97.7°F | Resp 18 | Wt 212.6 lb

## 2022-09-25 DIAGNOSIS — M1990 Unspecified osteoarthritis, unspecified site: Secondary | ICD-10-CM | POA: Diagnosis not present

## 2022-09-25 DIAGNOSIS — E611 Iron deficiency: Secondary | ICD-10-CM | POA: Diagnosis present

## 2022-09-25 DIAGNOSIS — G4730 Sleep apnea, unspecified: Secondary | ICD-10-CM | POA: Insufficient documentation

## 2022-09-25 DIAGNOSIS — G2581 Restless legs syndrome: Secondary | ICD-10-CM | POA: Insufficient documentation

## 2022-09-25 DIAGNOSIS — D509 Iron deficiency anemia, unspecified: Secondary | ICD-10-CM

## 2022-09-25 DIAGNOSIS — F32A Depression, unspecified: Secondary | ICD-10-CM | POA: Insufficient documentation

## 2022-09-25 DIAGNOSIS — I10 Essential (primary) hypertension: Secondary | ICD-10-CM | POA: Insufficient documentation

## 2022-09-25 DIAGNOSIS — Z79899 Other long term (current) drug therapy: Secondary | ICD-10-CM | POA: Insufficient documentation

## 2022-09-25 DIAGNOSIS — E782 Mixed hyperlipidemia: Secondary | ICD-10-CM | POA: Insufficient documentation

## 2022-09-25 DIAGNOSIS — K219 Gastro-esophageal reflux disease without esophagitis: Secondary | ICD-10-CM | POA: Diagnosis not present

## 2022-09-25 DIAGNOSIS — M5412 Radiculopathy, cervical region: Secondary | ICD-10-CM | POA: Diagnosis not present

## 2022-09-25 DIAGNOSIS — N400 Enlarged prostate without lower urinary tract symptoms: Secondary | ICD-10-CM | POA: Diagnosis not present

## 2022-09-25 LAB — IRON AND IRON BINDING CAPACITY (CC-WL,HP ONLY)
Iron: 75 ug/dL (ref 45–182)
Saturation Ratios: 22 % (ref 17.9–39.5)
TIBC: 339 ug/dL (ref 250–450)
UIBC: 264 ug/dL (ref 117–376)

## 2022-09-25 LAB — CBC WITH DIFFERENTIAL (CANCER CENTER ONLY)
Abs Immature Granulocytes: 0.04 10*3/uL (ref 0.00–0.07)
Basophils Absolute: 0.1 10*3/uL (ref 0.0–0.1)
Basophils Relative: 1 %
Eosinophils Absolute: 0.2 10*3/uL (ref 0.0–0.5)
Eosinophils Relative: 2 %
HCT: 46.1 % (ref 39.0–52.0)
Hemoglobin: 15.9 g/dL (ref 13.0–17.0)
Immature Granulocytes: 0 %
Lymphocytes Relative: 32 %
Lymphs Abs: 3 10*3/uL (ref 0.7–4.0)
MCH: 31.7 pg (ref 26.0–34.0)
MCHC: 34.5 g/dL (ref 30.0–36.0)
MCV: 92 fL (ref 80.0–100.0)
Monocytes Absolute: 0.7 10*3/uL (ref 0.1–1.0)
Monocytes Relative: 8 %
Neutro Abs: 5.3 10*3/uL (ref 1.7–7.7)
Neutrophils Relative %: 57 %
Platelet Count: 233 10*3/uL (ref 150–400)
RBC: 5.01 MIL/uL (ref 4.22–5.81)
RDW: 12.6 % (ref 11.5–15.5)
WBC Count: 9.4 10*3/uL (ref 4.0–10.5)
nRBC: 0 % (ref 0.0–0.2)

## 2022-09-25 LAB — CMP (CANCER CENTER ONLY)
ALT: 41 U/L (ref 0–44)
AST: 39 U/L (ref 15–41)
Albumin: 4.3 g/dL (ref 3.5–5.0)
Alkaline Phosphatase: 74 U/L (ref 38–126)
Anion gap: 6 (ref 5–15)
BUN: 22 mg/dL (ref 8–23)
CO2: 24 mmol/L (ref 22–32)
Calcium: 9.4 mg/dL (ref 8.9–10.3)
Chloride: 103 mmol/L (ref 98–111)
Creatinine: 0.84 mg/dL (ref 0.61–1.24)
GFR, Estimated: >60 mL/min (ref >60)
Glucose, Bld: 104 mg/dL — ABNORMAL HIGH (ref 70–99)
Potassium: 4.2 mmol/L (ref 3.5–5.1)
Sodium: 133 mmol/L — ABNORMAL LOW (ref 135–145)
Total Bilirubin: 1.4 mg/dL — ABNORMAL HIGH (ref 0.3–1.2)
Total Protein: 7.1 g/dL (ref 6.5–8.1)

## 2022-09-25 LAB — VITAMIN D 25 HYDROXY (VIT D DEFICIENCY, FRACTURES): Vit D, 25-Hydroxy: 44.21 ng/mL (ref 30–100)

## 2022-09-25 LAB — VITAMIN B12: Vitamin B-12: 479 pg/mL (ref 180–914)

## 2022-09-25 NOTE — Progress Notes (Signed)
HEMATOLOGY/ONCOLOGY CLINIC NOTE  Date of Service: 09/25/2022  Patient Care Team: Hadley Pen, MD as PCP - General (Family Medicine) Tonny Bollman, MD as PCP - Cardiology (Cardiology)  CHIEF COMPLAINTS/PURPOSE OF CONSULTATION:  Evaluation and management of low ferritin level of 5 without significant anemia.  HISTORY OF PRESENTING ILLNESS:   Randy Ramos is a wonderful 77 y.o. male who has been referred to [place] by Molly Maduro A. Sherral Hammers, MD for evaluation and management of low ferritin levels related to iron deficiency without anemia.   Today, he is accompanied by his wife. He reports that this is the first time he has been iron deficient. He reports that his iron work-up was triggered by his restless leg syndrome.  Patient denies any nose bleeds, gum bleeds, black stools, blood in stool/urine, sudden weight changes, or recent surgeries. He denies any dietary restrictions/intolerances or previous bowel surgeries that may have caused absorption issues.  He reports that his stomach has been sensitive for a while and denies any stomach ulcers.  Patient denies any GI issues besides acid reflux. He has taken Protonix for several years and does also take Pepsid.  Patient last received a colonoscopy in 2020 and an endoscopy 1 year ago. There was no concern for ulcers at the time. He does note previous benign polyps. Patient denies any recent stool testing.  He does complain of frequent pain in his abdomen which he describes as a knot that radiates to his back. He believes the pain is triggered by foods. He denies any previous hernia.   Patient does endorse worsened memory function.   He complains of worsened restless legs syndrome over the last few months. He has had this disease for approximately 20 years. Patient describes that symptoms begin in his bilateral legs and radiate throughout his body. His symptoms do cause sleep disturbances. He reports that his symptoms are especially  bothersome when he sits/lays down. He reports that medications such as Mirapex did previously improve symptoms, but did not resolve. He denies any previous concern for neuropathy. He reports that caffeine and strong medications does worsens symptoms. Patient has tried essential oils and tonic water to manage restless legs. He also reports taking Gabapentin for two days, which worsened restless leg symptoms. He has not tried taking Benedryl to manage restless less. Patient does endorse a lack of energy, which he attributes to restless leg. Patient is concerned as his restless leg syndrome does limit his social life. Patient does not have a sleep doctor. He is unsure if his thyroid functions have been previously checked.   Patient was diagnosed with sleep apnea 5-6 years ago but does not currently use a CPAP machine. His weight has been fluctuating between 180-210 pounds over the last 5-6 years.   Patient regularly stays active but denies any strenuous exercise. His wife reports that he is slightly SOB when walking. Patient denies any back pain, weight changes, change in bowel habits, or abdominal pain.  Patient has been taking 45 MG p.o. iron tablets for two weeks, which causes abdominal pain.   In regards to his diet, patient consumes meat approximately once a week. Patient does regularly drink coffee in the mornings and drinks sodas occasionally.    INTERVAL HISTORY: Randy Ramos is a wonderful 77 y.o. male who is here for continued evaluation and management of low ferritin levels related to iron deficiency without anemia. Patient was initially seen by me on 05/25/2022.   Patient is accompanied by his wife  during this visit. He notes that IV Iron did not help his restless leg syndrome. Patient notes that his restless leg syndrome has slightly worsened since our last visit as it is affecting him during the day time now.   He is regularly uses his Cpap machine, but notes he still does not seep  consistently throughout the night. His wife notes that the patient is restful when he uses his cpap machine. Patient had home sleep study done in the past and has been following his PCP for sleep apnea.   Patient denies nerve conduction study.   He tolerated his Iv Iron infusions well without any new or severe toxicities.   He denies any recent infection issues, fever, chills, night sweats, abdominal pain, chest pain, back pain, abnormal bowel movement, or leg swelling. He does complain of occasional acid reflux.  Patient has a colonoscopy scheduled next week.   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  01/2010 LHC with minimal nonobstructive CAD b.qualifier: Diagnosis of  By: Manson Passey, RN, BSN, Lauren   BPH (benign prostatic hyperplasia)    Essential (primary) hypertension    Mixed hyperlipidemia    Has expereinced some intolerance to statins in the past   Pericarditis     SURGICAL HISTORY: No past surgical history on file.  SOCIAL HISTORY: Social History   Socioeconomic History   Marital status: Married    Spouse name: Not on file   Number of children: Not on file   Years of education: Not on file   Highest education level: Not on file  Occupational History   Not on file  Tobacco Use   Smoking status: Never   Smokeless tobacco: Former  Advertising account planner   Vaping status: Never Used  Substance and Sexual Activity   Alcohol use: No   Drug use: Never   Sexual activity: Not on file  Other Topics Concern   Not on file  Social History Narrative   Not on file   Social Determinants of Health   Financial Resource Strain: Not on file  Food Insecurity: Not on file  Transportation Needs: Not on file  Physical Activity: Not on file  Stress: Not on file  Social Connections: Not on file  Intimate Partner Violence: Not on file    FAMILY HISTORY: Family History  Problem Relation Age of Onset   Hypertension Mother    Hypertension  Father    Restless legs syndrome Sister     ALLERGIES:  has No Known Allergies.  MEDICATIONS:  Current Outpatient Medications  Medication Sig Dispense Refill   chlorhexidine (PERIDEX) 0.12 % solution PRN     Coenzyme Q10-Vitamin E (QUNOL ULTRA COQ10 PO) Take 1 capsule by mouth daily.     ezetimibe (ZETIA) 10 MG tablet Take 10 mg by mouth daily.  2   famotidine (PEPCID) 40 MG tablet Take 1 tablet by mouth 2 (two) times daily.     finasteride (PROSCAR) 5 MG tablet Take 5 mg by mouth daily.  3   fluticasone (FLONASE) 50 MCG/ACT nasal spray Place 1 spray into both nostrils as needed.     lisinopril (ZESTRIL) 10 MG tablet Take 1 tablet (10 mg total) by mouth daily. 90 tablet 3   pantoprazole (PROTONIX) 40 MG tablet Take 40 mg by mouth daily.     rOPINIRole (REQUIP) 2 MG tablet Take by mouth.     tamsulosin (FLOMAX) 0.4 MG CAPS capsule Take 0.8 mg by  mouth at bedtime.  3   No current facility-administered medications for this visit.    REVIEW OF SYSTEMS:    10 Point review of Systems was done is negative except as noted above.  PHYSICAL EXAMINATION: ECOG PERFORMANCE STATUS: 1 - Symptomatic but completely ambulatory  . Vitals:   09/25/22 1140  BP: (!) 147/57  Pulse: 69  Resp: 18  Temp: 97.7 F (36.5 C)  SpO2: 99%    Filed Weights   09/25/22 1140  Weight: 212 lb 9.6 oz (96.4 kg)    .Body mass index is 31.4 kg/m.  GENERAL:alert, in no acute distress and comfortable SKIN: no acute rashes, no significant lesions EYES: conjunctiva are pink and non-injected, sclera anicteric OROPHARYNX: MMM, no exudates, no oropharyngeal erythema or ulceration NECK: supple, no JVD LYMPH:  no palpable lymphadenopathy in the cervical, axillary or inguinal regions LUNGS: clear to auscultation b/l with normal respiratory effort HEART: regular rate & rhythm ABDOMEN:  normoactive bowel sounds , non tender, not distended. No hepatosplenomegaly. Extremity: no pedal edema PSYCH: alert & oriented  x 3 with fluent speech NEURO: no focal motor/sensory deficits  LABORATORY DATA:  I have reviewed the data as listed  Iron panel 05/07/2022:    Marland Kitchen    Latest Ref Rng & Units 09/25/2022   11:15 AM 07/19/2022    2:36 PM 03/31/2020    7:30 PM  CBC  WBC 4.0 - 10.5 K/uL 9.4  8.7  14.7   Hemoglobin 13.0 - 17.0 g/dL 40.9  81.1  91.4   Hematocrit 39.0 - 52.0 % 46.1  44.8  44.4   Platelets 150 - 400 K/uL 233  248.0  273     .    Latest Ref Rng & Units 09/25/2022   11:15 AM 05/10/2021   11:21 AM 03/31/2020    7:30 PM  CMP  Glucose 70 - 99 mg/dL 782  956  213   BUN 8 - 23 mg/dL 22  17  21    Creatinine 0.61 - 1.24 mg/dL 0.86  5.78  4.69   Sodium 135 - 145 mmol/L 133  141  137   Potassium 3.5 - 5.1 mmol/L 4.2  4.9  3.8   Chloride 98 - 111 mmol/L 103  102  100   CO2 22 - 32 mmol/L 24  24  27    Calcium 8.9 - 10.3 mg/dL 9.4  9.8  9.3   Total Protein 6.5 - 8.1 g/dL 7.1     Total Bilirubin 0.3 - 1.2 mg/dL 1.4     Alkaline Phos 38 - 126 U/L 74     AST 15 - 41 U/L 39     ALT 0 - 44 U/L 41      . Lab Results  Component Value Date   IRON 75 09/25/2022   TIBC 339 09/25/2022   IRONPCTSAT 22 09/25/2022   (Iron and TIBC)  Lab Results  Component Value Date   FERRITIN 60 09/25/2022      RADIOGRAPHIC STUDIES: I have personally reviewed the radiological images as listed and agreed with the findings in the report. No results found.  ASSESSMENT & PLAN:   Wonderful 77 y.o. male with:  Severe Iron deficiency without significant anemia but with severe restless leg syndrome. Gerd Hypertension Cervial radiculopathy Osteoarthritis BPH Hyperlipidemia Restless leg syndrome Depression   PLAN: -Discussed lab results from today, 09/24/2022, with the patient. CBC is stable, with improved hemoglobin. CMP is stable overall.  -Iron lab results ferritin of 60 and iron  saturation of 22% -Recommend to follow-up with his PCP regarding sleep apnea and potential another sleep study or getting  referred to sleep specialist.  -Discussed with the patient that sleep apnea can cause restless leg syndrome and other issues.    FOLLOW-UP: Schedule for 1 additional dose of Injectafer  RTC with Dr Candise Che with labs in 6 months  The total time spent in the appointment was 20 minutes* .  All of the patient's questions were answered with apparent satisfaction. The patient knows to call the clinic with any problems, questions or concerns.   Wyvonnia Lora MD MS AAHIVMS [facility] Hematology/Oncology Physician [facility]  .*Total Encounter Time as defined by the Centers for Medicare and Medicaid Services includes, in addition to the face-to-face time of a patient visit (documented in the note above) non-face-to-face time: obtaining and reviewing outside history, ordering and reviewing medications, tests or procedures, care coordination (communications with other health care professionals or caregivers) and documentation in the medical record.  I,Param Shah,acting as a Neurosurgeon for Wyvonnia Lora, MD.,have documented all relevant documentation on the behalf of Wyvonnia Lora, MD,as directed by  Wyvonnia Lora, MD while in the presence of Wyvonnia Lora, MD.  .I have reviewed the above documentation for accuracy and completeness, and I agree with the above. Johney Maine MD

## 2022-09-26 ENCOUNTER — Ambulatory Visit: Payer: PPO | Admitting: Internal Medicine

## 2022-09-26 LAB — FERRITIN: Ferritin: 60 ng/mL (ref 24–336)

## 2022-09-30 ENCOUNTER — Encounter: Payer: Self-pay | Admitting: Hematology

## 2022-10-01 ENCOUNTER — Encounter: Payer: Self-pay | Admitting: Hematology

## 2022-10-02 ENCOUNTER — Ambulatory Visit (AMBULATORY_SURGERY_CENTER): Payer: PPO | Admitting: Internal Medicine

## 2022-10-02 ENCOUNTER — Encounter: Payer: Self-pay | Admitting: Internal Medicine

## 2022-10-02 VITALS — BP 144/67 | HR 61 | Temp 97.7°F | Resp 13 | Ht 69.0 in | Wt 209.0 lb

## 2022-10-02 DIAGNOSIS — D123 Benign neoplasm of transverse colon: Secondary | ICD-10-CM

## 2022-10-02 DIAGNOSIS — D120 Benign neoplasm of cecum: Secondary | ICD-10-CM

## 2022-10-02 DIAGNOSIS — D122 Benign neoplasm of ascending colon: Secondary | ICD-10-CM

## 2022-10-02 DIAGNOSIS — E611 Iron deficiency: Secondary | ICD-10-CM | POA: Diagnosis not present

## 2022-10-02 DIAGNOSIS — K319 Disease of stomach and duodenum, unspecified: Secondary | ICD-10-CM | POA: Diagnosis not present

## 2022-10-02 DIAGNOSIS — K219 Gastro-esophageal reflux disease without esophagitis: Secondary | ICD-10-CM

## 2022-10-02 DIAGNOSIS — K317 Polyp of stomach and duodenum: Secondary | ICD-10-CM

## 2022-10-02 DIAGNOSIS — K635 Polyp of colon: Secondary | ICD-10-CM | POA: Diagnosis not present

## 2022-10-02 MED ORDER — SODIUM CHLORIDE 0.9 % IV SOLN: 500.0000 mL | INTRAVENOUS | Status: DC

## 2022-10-02 NOTE — Progress Notes (Signed)
Called to room to assist during endoscopic procedure.  Patient ID and intended procedure confirmed with present staff. Received instructions for my participation in the procedure from the performing physician.  

## 2022-10-02 NOTE — Patient Instructions (Addendum)
- Resume previous diet. - Continue present medications. - Await pathology results. - See the other procedure note for documentation of additional recommendations. - Repeat colonoscopy is recommended for surveillance. The colonoscopy date will be determined after pathology results from today's exam become available for review.  YOU HAD AN ENDOSCOPIC PROCEDURE TODAY AT THE [facility]:   Refer to the procedure report that was given to you for any specific questions about what was found during the examination.  If the procedure report does not answer your questions, please call your gastroenterologist to clarify.  If you requested that your care partner not be given the details of your procedure findings, then the procedure report has been included in a sealed envelope for you to review at your convenience later.  YOU SHOULD EXPECT: Some feelings of bloating in the abdomen. Passage of more gas than usual.  Walking can help get rid of the air that was put into your GI tract during the procedure and reduce the bloating. If you had a lower endoscopy (such as a colonoscopy or flexible sigmoidoscopy) you may notice spotting of blood in your stool or on the toilet paper. If you underwent a bowel prep for your procedure, you may not have a normal bowel movement for a few days.  Please Note:  You might notice some irritation and congestion in your nose or some drainage.  This is from the oxygen used during your procedure.  There is no need for concern and it should clear up in a day or so.  SYMPTOMS TO REPORT IMMEDIATELY:  Following lower endoscopy (colonoscopy or flexible sigmoidoscopy):  Excessive amounts of blood in the stool  Significant tenderness or worsening of abdominal pains  Swelling of the abdomen that is new, acute  Fever of 100F or higher  Following upper endoscopy (EGD)  Vomiting of blood or coffee ground material  New chest pain or pain under the shoulder blades  Painful or  persistently difficult swallowing  New shortness of breath  Fever of 100F or higher  Black, tarry-looking stools  For urgent or emergent issues, a gastroenterologist can be reached at any hour by calling [phone]. Do not use MyChart messaging for urgent concerns.    DIET:  We do recommend a small meal at first, but then you may proceed to your regular diet.  Drink plenty of fluids but you should avoid alcoholic beverages for 24 hours.  ACTIVITY:  You should plan to take it easy for the rest of today and you should NOT DRIVE or use heavy machinery until tomorrow (because of the sedation medicines used during the test).    FOLLOW UP: Our staff will call the number listed on your records the next business day following your procedure.  We will call around 7:15- 8:00 am to check on you and address any questions or concerns that you may have regarding the information given to you following your procedure. If we do not reach you, we will leave a message.     If any biopsies were taken you will be contacted by phone or by letter within the next 1-3 weeks.  Please call us at [phone] if you have not heard about the biopsies in 3 weeks.    SIGNATURES/CONFIDENTIALITY: You and/or your care partner have signed paperwork which will be entered into your electronic medical record.  These signatures attest to the fact that that the information above on your After Visit Summary has been reviewed and is understood.  Full responsibility of the confidentiality of this discharge information lies with you and/or your care-partner.

## 2022-10-02 NOTE — Progress Notes (Signed)
To Pacu, VSS. Report to [email or web address] 

## 2022-10-02 NOTE — Op Note (Signed)
[facility] Patient Name: Randy Ramos Procedure Date: 10/02/2022 1:30 PM MRN: [id number] Endoscopist: Beverley Fiedler , MD, [id number] Age: 77 Referring MD:  Date of Birth: 01/10/1946 Gender: Male Account #: [id number] Procedure:                Colonoscopy Indications:              Iron deficiency anemia, history of adenomatous                            polyps in the colon Medicines:                Monitored Anesthesia Care Procedure:                Pre-Anesthesia Assessment:                           - Prior to the procedure, a History and Physical                            was performed, and patient medications and                            allergies were reviewed. The patient's tolerance of                            previous anesthesia was also reviewed. The risks                            and benefits of the procedure and the sedation                            options and risks were discussed with the patient.                            All questions were answered, and informed consent                            was obtained. Prior Anticoagulants: The patient has                            taken no anticoagulant or antiplatelet agents. ASA                            Grade Assessment: II - A patient with mild systemic                            disease. After reviewing the risks and benefits,                            the patient was deemed in satisfactory condition to                            undergo the procedure.  After obtaining informed consent, the colonoscope                            was passed under direct vision. Throughout the                            procedure, the patient's blood pressure, pulse, and                            oxygen saturations were monitored continuously. The                            Olympus Scope SN: [id number] was introduced through                            the anus and advanced to the cecum, identified  by                            appendiceal orifice and ileocecal valve. The                            colonoscopy was performed without difficulty. The                            patient tolerated the procedure well. The quality                            of the bowel preparation was good. The ileocecal                            valve, appendiceal orifice, and rectum were                            photographed. Scope In: 1:47:00 PM Scope Out: 2:02:29 PM Scope Withdrawal Time: 0 hours 13 minutes 49 seconds  Total Procedure Duration: 0 hours 15 minutes 29 seconds  Findings:                 The digital rectal exam was normal.                           A 5 mm polyp was found in the cecum. The polyp was                            sessile. The polyp was removed with a cold snare.                            Resection and retrieval were complete.                           A single small angioectasia with typical                            arborization was found in the ascending colon.  Two sessile polyps were found in the ascending                            colon. The polyps were 5 to 10 mm in size. These                            polyps were removed with a cold snare. Resection                            and retrieval were complete.                           A 6 mm polyp was found in the transverse colon. The                            polyp was sessile. The polyp was removed with a                            cold snare. Resection and retrieval were complete.                           Multiple small-mouthed diverticula were found in                            the descending colon.                           The retroflexed view of the distal rectum and anal                            verge was normal and showed no anal or rectal                            abnormalities. Complications:            No immediate complications. Estimated Blood Loss:     Estimated blood  loss was minimal. Impression:               - One 5 mm polyp in the cecum, removed with a cold                            snare. Resected and retrieved.                           - A single colonic angioectasia.                           - Two 5 to 10 mm polyps in the ascending colon,                            removed with a cold snare. Resected and retrieved.                           - One 6 mm polyp  in the transverse colon, removed                            with a cold snare. Resected and retrieved.                           - Mild diverticulosis in the descending colon.                           - The distal rectum and anal verge are normal on                            retroflexion view. Recommendation:           - Patient has a contact number available for                            emergencies. The signs and symptoms of potential                            delayed complications were discussed with the                            patient. Return to normal activities tomorrow.                            Written discharge instructions were provided to the                            patient.                           - Resume previous diet.                           - Continue present medications.                           - Await pathology results.                           - Repeat colonoscopy is recommended for                            surveillance. The colonoscopy date will be                            determined after pathology results from today's                            exam become available for review. Beverley Fiedler, MD 10/02/2022 2:16:16 PM This report has been signed electronically.

## 2022-10-02 NOTE — Op Note (Signed)
[facility] Patient Name: Randy Ramos Procedure Date: 10/02/2022 1:31 PM MRN: [id number] Endoscopist: Beverley Fiedler , MD, [id number] Age: 77 Referring MD:  Date of Birth: 11-24-1945 Gender: Male Account #: [id number] Procedure:                Upper GI endoscopy Indications:              Iron deficiency anemia, Gastro-esophageal reflux                            disease Medicines:                Monitored Anesthesia Care Procedure:                Pre-Anesthesia Assessment:                           - Prior to the procedure, a History and Physical                            was performed, and patient medications and                            allergies were reviewed. The patient's tolerance of                            previous anesthesia was also reviewed. The risks                            and benefits of the procedure and the sedation                            options and risks were discussed with the patient.                            All questions were answered, and informed consent                            was obtained. Prior Anticoagulants: The patient has                            taken no anticoagulant or antiplatelet agents. ASA                            Grade Assessment: II - A patient with mild systemic                            disease. After reviewing the risks and benefits,                            the patient was deemed in satisfactory condition to                            undergo the procedure.  After obtaining informed consent, the endoscope was                            passed under direct vision. Throughout the                            procedure, the patient's blood pressure, pulse, and                            oxygen saturations were monitored continuously. The                            GIF [id number] #[id number] was introduced through the                            mouth, and advanced to the second part of duodenum.                             The upper GI endoscopy was accomplished without                            difficulty. The patient tolerated the procedure                            well. Scope In: Scope Out: Findings:                 The examined esophagus was normal.                           Mild inflammation characterized by congestion                            (edema) and erythema was found in the gastric                            antrum. Biopsies were taken with a cold forceps for                            Helicobacter pylori testing.                           A few diminutive sessile polyps with no stigmata of                            recent bleeding were found in the gastric body.                            Sampling biopsies were taken with a cold forceps                            for histology.                           The examined duodenum was normal. Biopsies for  histology were taken with a cold forceps for                            evaluation of celiac disease. Complications:            No immediate complications. Estimated Blood Loss:     Estimated blood loss was minimal. Impression:               - Normal esophagus.                           - Gastritis. Biopsied.                           - A few gastric polyps. Benign and with typical                            appearance of fundic gland polyps. Biopsied.                           - Normal examined duodenum. Biopsied. Recommendation:           - Patient has a contact number available for                            emergencies. The signs and symptoms of potential                            delayed complications were discussed with the                            patient. Return to normal activities tomorrow.                            Written discharge instructions were provided to the                            patient.                           - Resume previous diet.                            - Continue present medications.                           - Await pathology results.                           - See the other procedure note for documentation of                            additional recommendations. Beverley Fiedler, MD 10/02/2022 2:12:33 PM This report has been signed electronically.

## 2022-10-02 NOTE — Progress Notes (Signed)
GASTROENTEROLOGY PROCEDURE H&P NOTE   Primary Care Physician: Hadley Pen, MD    Reason for Procedure:  Iron deficiency anemia  Plan:    EGD and colonoscopy  Patient is appropriate for endoscopic procedure(s) in the ambulatory ([facility]) setting.  The nature of the procedure, as well as the risks, benefits, and alternatives were carefully and thoroughly reviewed with the patient. Ample time for discussion and questions allowed. The patient understood, was satisfied, and agreed to proceed.     HPI: Randy Ramos is a 77 y.o. male who presents for EGD and colonoscopy.  Medical history as below.  Tolerated the prep.  No recent chest pain or shortness of breath.  No abdominal pain today.  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  01/2010 LHC with minimal nonobstructive CAD b.qualifier: Diagnosis of  By: Manson Passey, RN, BSN, Lauren   BPH (benign prostatic hyperplasia)    Cataract    Essential (primary) hypertension    GERD (gastroesophageal reflux disease)    Mixed hyperlipidemia    Has expereinced some intolerance to statins in the past   Pericarditis    Restless leg syndrome    Sleep apnea     History reviewed. No pertinent surgical history.  Prior to Admission medications   Medication Sig Start Date End Date Taking? Authorizing Provider  Coenzyme Q10-Vitamin E (QUNOL ULTRA COQ10 PO) Take 1 capsule by mouth daily.   Yes [provider]  ezetimibe (ZETIA) 10 MG tablet Take 10 mg by mouth daily. 12/21/16  Yes [provider]  famotidine (PEPCID) 40 MG tablet Take 1 tablet by mouth 2 (two) times daily. 05/31/21  Yes [provider]  Fexofenadine HCl (ALLEGRA ALLERGY PO) Take 1 Dose by mouth as needed.   Yes [provider]  finasteride (PROSCAR) 5 MG tablet Take 5 mg by mouth daily. 10/27/16  Yes [provider]  fluticasone (FLONASE) 50 MCG/ACT nasal spray Place 1 spray into both nostrils as needed.  05/27/21  Yes [provider]  lisinopril (ZESTRIL) 5 MG tablet Take 1 tablet by mouth daily. 10/02/21  Yes [provider]  pantoprazole (PROTONIX) 40 MG tablet Take 40 mg by mouth daily. 10/29/16  Yes [provider]  rOPINIRole (REQUIP) 2 MG tablet Take by mouth. 01/09/22  Yes [provider]  SODIUM FLUORIDE 5000 PPM 1.1 % PSTE Take 1.1 1e11 Vector Genomes by mouth See admin instructions. 08/24/22  Yes [provider]  tamsulosin (FLOMAX) 0.4 MG CAPS capsule Take 0.8 mg by mouth at bedtime. 10/27/16  Yes [provider]  chlorhexidine (PERIDEX) 0.12 % solution PRN 07/10/19   [provider]  ciclopirox (LOPROX) 0.77 % cream Apply 0.77 % topically 2 (two) times daily. 07/06/22   [provider]  lidocaine (XYLOCAINE) 2 % solution Use as directed 15 mLs in the mouth or throat 4 (four) times daily as needed. 07/12/22   [provider]  sertraline (ZOLOFT) 50 MG tablet Take 50 mg by mouth daily.    [provider]  traMADol (ULTRAM) 50 MG tablet Take 50 mg by mouth as needed. 01/15/22   [provider]    Current Outpatient Medications  Medication Sig Dispense Refill   Coenzyme Q10-Vitamin E (QUNOL ULTRA COQ10 PO) Take 1 capsule by mouth daily.     ezetimibe (ZETIA) 10 MG tablet Take 10 mg by mouth daily.  2   famotidine (PEPCID) 40 MG tablet Take 1 tablet by  mouth 2 (two) times daily.     Fexofenadine HCl (ALLEGRA ALLERGY PO) Take 1 Dose by mouth as needed.     finasteride (PROSCAR) 5 MG tablet Take 5 mg by mouth daily.  3   fluticasone (FLONASE) 50 MCG/ACT nasal spray Place 1 spray into both nostrils as needed.     lisinopril (ZESTRIL) 5 MG tablet Take 1 tablet by mouth daily.     pantoprazole (PROTONIX) 40 MG tablet Take 40 mg by mouth daily.     rOPINIRole (REQUIP) 2 MG tablet Take by mouth.     SODIUM FLUORIDE 5000 PPM 1.1 % PSTE Take 1.1 1e11 Vector Genomes by mouth See admin instructions.      tamsulosin (FLOMAX) 0.4 MG CAPS capsule Take 0.8 mg by mouth at bedtime.  3   chlorhexidine (PERIDEX) 0.12 % solution PRN     ciclopirox (LOPROX) 0.77 % cream Apply 0.77 % topically 2 (two) times daily.     lidocaine (XYLOCAINE) 2 % solution Use as directed 15 mLs in the mouth or throat 4 (four) times daily as needed.     sertraline (ZOLOFT) 50 MG tablet Take 50 mg by mouth daily.     traMADol (ULTRAM) 50 MG tablet Take 50 mg by mouth as needed.     Current Facility-Administered Medications  Medication Dose Route Frequency Provider Last Rate Last Admin   0.9 %  sodium chloride infusion  500 mL Intravenous Continuous Joshuwa Vecchio, Carie Caddy, MD        Allergies as of 10/02/2022   (No Known Allergies)    Family History  Problem Relation Age of Onset   Hypertension Mother    Esophageal cancer Father    Hypertension Father    Restless legs syndrome Sister    Colon cancer Neg Hx    Rectal cancer Neg Hx    Stomach cancer Neg Hx    Colon polyps Neg Hx     Social History   Socioeconomic History   Marital status: Married    Spouse name: Not on file   Number of children: Not on file   Years of education: Not on file   Highest education level: Not on file  Occupational History   Not on file  Tobacco Use   Smoking status: Never   Smokeless tobacco: Former  Advertising account planner   Vaping status: Never Used  Substance and Sexual Activity   Alcohol use: No   Drug use: Never   Sexual activity: Not on file  Other Topics Concern   Not on file  Social History Narrative   Not on file   Social Determinants of Health   Financial Resource Strain: Not on file  Food Insecurity: Not on file  Transportation Needs: Not on file  Physical Activity: Not on file  Stress: Not on file  Social Connections: Not on file  Intimate Partner Violence: Not on file    Physical Exam: Vital signs in last 24 hours: @BP  (!) 153/69   Pulse 61   Temp 97.7 F (36.5 C) (Temporal)   Ht 5\' 9"  (1.753 m)   Wt 209 lb (94.8  kg)   SpO2 99%   BMI 30.86 kg/m  GEN: NAD EYE: Sclerae anicteric ENT: MMM CV: Non-tachycardic Pulm: CTA b/l GI: Soft, NT/ND NEURO:  Alert & Oriented x 3   Erick Blinks, MD [facility]  10/02/2022 1:28 PM

## 2022-10-03 ENCOUNTER — Telehealth: Payer: Self-pay

## 2022-10-03 NOTE — Telephone Encounter (Signed)
  Follow up Call-     10/02/2022   12:49 PM  Call back number  Post procedure Call Back phone  # [phone]  Permission to leave phone message Yes     Patient questions:  Do you have a fever, pain , or abdominal swelling? No. Pain Score  0 *  Have you tolerated food without any problems? Yes.    Have you been able to return to your normal activities? Yes.    Do you have any questions about your discharge instructions: Diet   No. Medications  No. Follow up visit  No.  Do you have questions or concerns about your Care? No.  Actions: * If pain score is 4 or above: No action needed, pain <4.

## 2022-10-11 ENCOUNTER — Encounter: Payer: Self-pay | Admitting: Internal Medicine

## 2022-10-15 ENCOUNTER — Other Ambulatory Visit: Payer: Self-pay

## 2022-10-15 ENCOUNTER — Telehealth: Payer: Self-pay | Admitting: Hematology

## 2022-10-15 NOTE — Telephone Encounter (Signed)
Left patient a message regarding scheduled appointment times/dates; left patient callback number if needed for reschedule

## 2022-10-22 ENCOUNTER — Inpatient Hospital Stay: Payer: PPO | Attending: Hematology

## 2022-10-22 VITALS — BP 135/64 | HR 59 | Resp 16

## 2022-10-22 DIAGNOSIS — E611 Iron deficiency: Secondary | ICD-10-CM | POA: Insufficient documentation

## 2022-10-22 DIAGNOSIS — D509 Iron deficiency anemia, unspecified: Secondary | ICD-10-CM

## 2022-10-22 MED ORDER — LORATADINE 10 MG PO TABS
10.0000 mg | ORAL_TABLET | Freq: Once | ORAL | Status: AC
  Administered 2022-10-22: 10 mg via ORAL
  Filled 2022-10-22: qty 1

## 2022-10-22 MED ORDER — ACETAMINOPHEN 325 MG PO TABS
650.0000 mg | ORAL_TABLET | Freq: Once | ORAL | Status: AC
  Administered 2022-10-22: 650 mg via ORAL
  Filled 2022-10-22: qty 2

## 2022-10-22 MED ORDER — SODIUM CHLORIDE 0.9 % IV SOLN: Freq: Once | INTRAVENOUS | Status: AC

## 2022-10-22 MED ORDER — SODIUM CHLORIDE 0.9 % IV SOLN
750.0000 mg | Freq: Once | INTRAVENOUS | Status: AC
  Administered 2022-10-22: 750 mg via INTRAVENOUS
  Filled 2022-10-22: qty 15

## 2022-10-22 NOTE — Progress Notes (Signed)
Pt observed for 15 min post iron infusion. Tolerated well. VSS. No hx adverse s/s. Pt ambulated independently to lobby with family for d/c.

## 2022-10-22 NOTE — Patient Instructions (Signed)
Ferric Carboxymaltose Injection What is this medication? FERRIC CARBOXYMALTOSE (FER ik kar BOX ee MAWL tose) treats low levels of iron in your body (iron deficiency anemia). Iron is a mineral that plays an important role in making red blood cells, which carry oxygen from your lungs to the rest of your body. This medicine may be used for other purposes; ask your health care provider or pharmacist if you have questions. COMMON BRAND NAME(S): Injectafer What should I tell my care team before I take this medication? They need to know if you have any of these conditions: High blood pressure Hyperparathyroidism Inflammatory bowel disease Low levels of vitamin D in your blood Previously received ferric carboxymaltose Problems absorbing certain vitamins or phosphate in your body An unusual or allergic reaction to iron, other medications, foods, dyes, or preservatives Pregnant or trying to get pregnant Breastfeeding How should I use this medication? This medication is injected into a vein. It is given by your care team in a hospital or clinic setting. Talk to your care team about the use of this medication in children. While it may be given to children as young as 1 year for selected conditions, precautions do apply. Overdosage: If you think you have taken too much of this medicine contact a poison control center or emergency room at once. NOTE: This medicine is only for you. Do not share this medicine with others. What if I miss a dose? Keep appointments for follow-up doses. It is important not to miss your dose. Call your care team if you are unable to keep an appointment. What may interact with this medication? Do not take this medication with any of the following: Deferoxamine Dimercaprol Other iron products This list may not describe all possible interactions. Give your health care provider a list of all the medicines, herbs, non-prescription drugs, or dietary supplements you use. Also tell  them if you smoke, drink alcohol, or use illegal drugs. Some items may interact with your medicine. What should I watch for while using this medication? Visit your care team for regular checks on your progress. Tell your care team if your symptoms do not start to get better or if they get worse. You may need blood work while you are taking this medication. You may need to eat more foods that contain iron. Talk to your care team. Foods that contain iron include whole grains/cereals, dried fruits, beans, or peas, leafy green vegetables, and organ meats (liver, kidney). What side effects may I notice from receiving this medication? Side effects that you should report to your care team as soon as possible: Allergic reactions--skin rash, itching, hives, swelling of the face, lips, tongue, or throat Increase in blood pressure Low blood pressure--dizziness, feeling faint or lightheaded, blurry vision Low phosphorus level--fatigue, muscle weakness or pain, bone or joint pain, bone fractures Shortness of breath Side effects that usually do not require medical attention (report to your care team if they continue or are bothersome): Flushing Headache Nausea Pain, redness, or irritation at injection site Vomiting This list may not describe all possible side effects. Call your doctor for medical advice about side effects. You may report side effects to FDA at 1-800-FDA-1088. Where should I keep my medication? This medication is given in a hospital or clinic. It will not be stored at home. NOTE: This sheet is a summary. It may not cover all possible information. If you have questions about this medicine, talk to your doctor, pharmacist, or health care provider.  2023 Elsevier/Gold  Standard (2020-02-19 00:00:00)

## 2022-10-30 NOTE — Progress Notes (Unsigned)
No chief complaint on file.   HISTORY OF PRESENT ILLNESS:  10/30/22 ALL:  Randy Ramos is a 77 y.o. male here today for follow up for RLS. He was seen in consult with Dr [name] 04/2022 who advised to limit ropinirole to 4mg  daily and start gabapentin 300mg  daily around dinner. Since,    HISTORY (copied from Dr Teofilo Pod previous note)  Dear Dr. Wynetta Emery,   I saw your patient, Randy Ramos, upon your kind request in my neurologic clinic today for initial consultation of his restless legs syndrome.  The patient is accompanied by his wife and daughter today.  As you know, Randy Ramos is a 77 year old male with an underlying medical history of hypertension, hyperlipidemia, history of pericarditis, BPH, OSA, cervical radiculopathy, chronic neck and low back pain, who reports a longstanding history of restless leg symptoms.  Symptoms date back to at least 20 years ago.  In the past 10 years he has become worse.  Symptoms have been treated by primary care and he has tried and failed several medications to help him sleep at night as well.  He is restless at night, symptoms got worse when he tried hydroxyzine for sleep.  His primary care tried him on levodopa which made him nauseated and he even had vomiting on it.  He may have tried trazodone as he recalls which made him sleepy but made his legs worse.  He describes a sensation of restlessness, having to move his legs, twitching and then his legs and symptoms originally started in the evenings but have become worse to where the symptoms started in the late afternoon or early afternoon, sometimes all day and sometimes affect the whole body including upper body.  He has a history of sleep apnea but has not been using his CPAP or AutoPap machine consistently he admits.  He has been sleeping in a recliner.  He limits his caffeine to a third cup of regular coffee per day, otherwise decaf coffee, occasional soda, also decaf.  He drinks no alcohol, he is a non-smoker.   He hydrates well with water.  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  He lives with his wife, his daughter has started noticing some tiredness in her legs but no frank restless leg symptoms, his son has no symptoms, his older sister had some restless leg symptoms.  He has been on sertraline, currently 25 mg daily to help him sleep at night, it is not helping.   I reviewed your office note from 03/15/2022.  He has a prescription for gabapentin but has not tried it.  He is currently on a high dose of ropinirole, 2 mg strength up to 6 mg daily, sometimes even more than that.  He takes it in half pill increments starting around 5 PM with several half pills taken until bedtime.     He had a MRI of the cervical spine without contrast through your office on 07/20/2021 and I reviewed the report: Impression: Multilevel degenerative changes as above with uncovertebral spurring and facet arthropathy at C5-C6 and C7-T1 resulting in severe left neural foraminal stenosis.  Moderate to severe right neuroforaminal stenosis at C3-C4 and moderate right neural foraminal stenosis and moderate spinal canal stenosis at C4-C6.  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  He does have the severe foraminal stenosis on the left at C7-T1 which could produce C8 radiculopathy.  On EMG, the C8 paraspinals did test normal but that could be due to to sparing of the cervicals paraspinals or early reinnervation.  However, there were some subtle findings that could also be consistent with lower trunk plexopathy which includes borderline low median sensory amplitudes left versus right along with the normal study of the cervical paraspinals.  It is conceivable he even has  a double crush syndrome.  Possibly a C8 nerve root block could add some diagnostic information.   REVIEW OF SYSTEMS: Out of a complete 14 system review of symptoms, the patient complains only of the following symptoms, and all other reviewed systems are negative.   ALLERGIES: No Known Allergies   HOME MEDICATIONS: Outpatient Medications Prior to Visit  Medication Sig Dispense Refill   chlorhexidine (PERIDEX) 0.12 % solution PRN     ciclopirox (LOPROX) 0.77 % cream Apply 0.77 % topically 2 (two) times daily.     Coenzyme Q10-Vitamin E (QUNOL ULTRA COQ10 PO) Take 1 capsule by mouth daily.     ezetimibe (ZETIA) 10 MG tablet Take 10 mg by mouth daily.  2   famotidine (PEPCID) 40 MG tablet Take 1 tablet by mouth 2 (two) times daily.     Fexofenadine HCl (ALLEGRA ALLERGY PO) Take 1 Dose by mouth as needed.     finasteride (PROSCAR) 5 MG tablet Take 5 mg by mouth daily.  3   fluticasone (FLONASE) 50 MCG/ACT nasal spray Place 1 spray into both nostrils as needed.     lidocaine (XYLOCAINE) 2 % solution Use as directed 15 mLs in the mouth or throat 4 (four) times daily as needed.     lisinopril (ZESTRIL) 5 MG tablet Take 1 tablet by mouth daily.     pantoprazole (PROTONIX) 40 MG tablet Take 40 mg by mouth daily.     rOPINIRole (REQUIP) 2 MG tablet Take by mouth.     sertraline (ZOLOFT) 50 MG tablet Take 50 mg by mouth daily.     SODIUM FLUORIDE 5000 PPM 1.1 % PSTE Take 1.1 1e11 [employer] by mouth See admin instructions.     tamsulosin (FLOMAX) 0.4 MG CAPS capsule Take 0.8 mg by mouth at bedtime.  3   traMADol (ULTRAM) 50 MG tablet Take 50 mg by mouth as needed.     No facility-administered medications prior to visit.     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   FAMILY HISTORY: Family History  Problem Relation Age of Onset   Hypertension Mother    Esophageal cancer Father    Hypertension Father    Restless legs syndrome Sister    Colon cancer Neg Hx    Rectal cancer Neg Hx    Stomach cancer Neg Hx    Colon polyps Neg Hx      SOCIAL  HISTORY: Social History   Socioeconomic History   Marital status: Married    Spouse name: Not on file   Number of children: Not on file   Years of education: Not on file   Highest education level: Not on file  Occupational History   Not on file  Tobacco Use   Smoking status: Never   Smokeless tobacco: Former  Advertising account planner   Vaping status: Never Used  Substance and Sexual Activity   Alcohol use: No   Drug use: Never   Sexual activity: Not on file  Other Topics Concern   Not on file  Social History Narrative   Not on file   Social Determinants of Health   Financial Resource Strain: Not on file  Food Insecurity: Not on file  Transportation Needs: Not on file  Physical Activity: Not on file  Stress: Not on file  Social Connections: Not on file  Intimate Partner Violence: Not on file     PHYSICAL EXAM  There were no vitals filed for this visit. There is no height or weight on file to calculate BMI.  Generalized: Well developed, in no acute distress  Cardiology: normal rate and rhythm, no murmur auscultated  Respiratory: clear to auscultation bilaterally    Neurological examination  Mentation: Alert oriented to time, place, history taking. Follows all commands speech and language fluent Cranial nerve II-XII: Pupils were equal round reactive to light. Extraocular movements were full, visual field were full on confrontational  test. Facial sensation and strength were normal. Uvula tongue midline. Head turning and shoulder shrug  were normal and symmetric. Motor: The motor testing reveals 5 over 5 strength of all 4 extremities. Good symmetric motor tone is noted throughout.  Sensory: Sensory testing is intact to soft touch on all 4 extremities. No evidence of extinction is noted.  Coordination: Cerebellar testing reveals good finger-nose-finger and heel-to-shin bilaterally.  Gait and station: Gait is normal. Tandem gait is normal. Romberg is negative. No drift is seen.  Reflexes: Deep tendon reflexes are symmetric and normal bilaterally.    DIAGNOSTIC DATA (LABS, IMAGING, TESTING) - I reviewed patient records, labs, notes, testing and imaging myself where available.  Lab Results  Component Value Date   WBC 9.4 09/25/2022   HGB 15.9 09/25/2022   HCT 46.1 09/25/2022   MCV 92.0 09/25/2022   PLT 233 09/25/2022      Component Value Date/Time   NA 133 (L) 09/25/2022 1115   NA 141 05/10/2021 1121   K 4.2 09/25/2022 1115   CL 103 09/25/2022 1115   CO2 24 09/25/2022 1115   GLUCOSE 104 (H) 09/25/2022 1115   BUN 22 09/25/2022 1115   BUN 17 05/10/2021 1121   CREATININE 0.84 09/25/2022 1115   CALCIUM 9.4 09/25/2022 1115   PROT 7.1 09/25/2022 1115   ALBUMIN 4.3 09/25/2022 1115   AST 39 09/25/2022 1115   ALT 41 09/25/2022 1115   ALKPHOS 74 09/25/2022 1115   BILITOT 1.4 (H) 09/25/2022 1115   GFRNONAA >60 09/25/2022 1115   No results found for: "CHOL", "HDL", "LDLCALC", "LDLDIRECT", "TRIG", "CHOLHDL" Lab Results  Component Value Date   HGBA1C 6.3 (H) 05/01/2022   Lab Results  Component Value Date   VITAMINB12 479 09/25/2022   Lab Results  Component Value Date   TSH 2.780 05/01/2022        No data to display               No data to display  ASSESSMENT AND PLAN  77 y.o. year old male  has a past medical history of ANGINA, STABLE (01/11/2010), BPH (benign prostatic hyperplasia),  Cataract, Essential (primary) hypertension, GERD (gastroesophageal reflux disease), Mixed hyperlipidemia, Pericarditis, Restless leg syndrome, and Sleep apnea. here with    No diagnosis found.  [name] ***.  Healthy lifestyle habits encouraged. *** will follow up with PCP as directed. *** will return to see me in ***, sooner if needed. *** verbalizes understanding and agreement with this plan.   No orders of the defined types were placed in this encounter.    No orders of the defined types were placed in this encounter.    Shawnie Dapper, MSN, FNP-C 10/30/2022, 3:23 PM  [facility] [address], [address] [place], [place] [address] [phone]

## 2022-10-30 NOTE — Patient Instructions (Signed)
Below is our plan:  We will start ropinirole XR 2mg  daily at 4pm. Do not half this tablet. You can take up to 2mg  of ropirole IR before bedtime. You can resume gabapentin 300mg  at bedtime if you like.I will get you scheduled for a sleep study.   Please make sure you are staying well hydrated. I recommend 50-60 ounces daily. Well balanced diet and regular exercise encouraged. Consistent sleep schedule with 6-8 hours recommended.   Please continue follow up with care team as directed.   Follow up with Dr [name] in 3-4 months   You may receive a survey regarding today's visit. I encourage you to leave honest feed back as I do use this information to improve patient care. Thank you for seeing me today!

## 2022-11-01 ENCOUNTER — Ambulatory Visit: Payer: PPO | Admitting: Family Medicine

## 2022-11-01 ENCOUNTER — Encounter: Payer: Self-pay | Admitting: Family Medicine

## 2022-11-01 VITALS — BP 158/80 | HR 57 | Ht 69.0 in | Wt 204.0 lb

## 2022-11-01 DIAGNOSIS — G2581 Restless legs syndrome: Secondary | ICD-10-CM

## 2022-11-01 DIAGNOSIS — Z8669 Personal history of other diseases of the nervous system and sense organs: Secondary | ICD-10-CM | POA: Diagnosis not present

## 2022-11-01 MED ORDER — ROPINIROLE HCL ER 2 MG PO TB24: 2.0000 mg | ORAL_TABLET | Freq: Every day | ORAL | 1 refills | Status: DC

## 2022-11-05 ENCOUNTER — Telehealth: Payer: Self-pay | Admitting: Family Medicine

## 2022-11-05 NOTE — Telephone Encounter (Signed)
[facility]- [facility] pending faxed notes

## 2022-11-08 ENCOUNTER — Encounter: Payer: Self-pay | Admitting: Family Medicine

## 2022-11-08 NOTE — Telephone Encounter (Signed)
Called [facility] to check the status- it is still pending.

## 2022-11-19 NOTE — Telephone Encounter (Signed)
[facility]TA Berkley Harvey: [id number] (exp. 11/05/22 to 02/03/23)

## 2022-12-16 ENCOUNTER — Encounter: Payer: Self-pay | Admitting: Cardiovascular Disease

## 2022-12-17 ENCOUNTER — Telehealth: Payer: Self-pay | Admitting: Cardiovascular Disease

## 2022-12-17 NOTE — Telephone Encounter (Signed)
Pt's daughter would like a callback regarding getting an appt for the pt. Please advise

## 2022-12-17 NOTE — Telephone Encounter (Signed)
Called and spoke with wife Consuella Lose who states daughter was requesting appt for patient due to elevated blood pressure recently. Wife states it's been really high and pt is concerned. Offered first available this Thursday, but wife denied and stated Friday would be better. Scheduled for 10:40 this Friday.

## 2022-12-17 NOTE — Telephone Encounter (Signed)
Scheduled w/[facility] for this Friday 12/21/22

## 2022-12-21 ENCOUNTER — Ambulatory Visit: Payer: PPO | Attending: Cardiovascular Disease | Admitting: Cardiovascular Disease

## 2022-12-21 ENCOUNTER — Encounter: Payer: Self-pay | Admitting: Cardiovascular Disease

## 2022-12-21 VITALS — BP 168/82 | HR 68 | Resp 16 | Ht 69.0 in | Wt 210.4 lb

## 2022-12-21 DIAGNOSIS — I10 Essential (primary) hypertension: Secondary | ICD-10-CM

## 2022-12-21 DIAGNOSIS — R0602 Shortness of breath: Secondary | ICD-10-CM

## 2022-12-21 MED ORDER — LISINOPRIL 20 MG PO TABS
20.0000 mg | ORAL_TABLET | Freq: Every day | ORAL | 3 refills | Status: DC
Start: 2022-12-21 — End: 2023-07-15

## 2022-12-21 MED ORDER — AMLODIPINE BESYLATE 5 MG PO TABS: 5.0000 mg | ORAL_TABLET | Freq: Every day | ORAL | 3 refills | Status: DC

## 2022-12-21 NOTE — Progress Notes (Signed)
Cardiology Office Note:    Date:  12/21/2022   ID:  Randy Ramos, DOB 10-22-1945, MRN [id number]  PCP:  Hadley Pen, MD   [facility] Providers Cardiologist:  Tonny Bollman, MD     Referring MD: Hadley Pen, MD   Chief Complaint  Patient presents with   Essential hypertension   Nonobstructive atherosclerosis of coronary artery   Follow-up         History of Present Illness:    Randy Ramos is a 77 y.o. male with a hx of hypertension and mixed hyperlipidemia, presenting for follow-up evaluation.  The patient has a history of nonobstructive CAD and remote history of pericarditis.  He is here with his daughter today.  His blood pressure has been elevated.  They bring in readings and he is consistently running in the 150s over 80s.  He has been taking his medications as instructed, currently on lisinopril 10 mg daily.  The patient complains of shortness of breath with walking up a hill.  Otherwise he denies any specific symptoms.  He has no leg swelling, heart palpitations, chest pain, or chest pressure.  He denies orthopnea or PND.  He still has problems with his restless leg syndrome.  Blood pressures are most elevated in the mornings.   Current Medications: Current Meds  Medication Sig   amLODipine (NORVASC) 5 MG tablet Take 1 tablet (5 mg total) by mouth daily.   ciclopirox (LOPROX) 0.77 % cream Apply 0.77 % topically 2 (two) times daily.   Coenzyme Q10-Vitamin E (QUNOL ULTRA COQ10 PO) Take 1 capsule by mouth daily.   ezetimibe (ZETIA) 10 MG tablet Take 10 mg by mouth daily.   famotidine (PEPCID) 40 MG tablet Take 1 tablet by mouth 2 (two) times daily.   Fexofenadine HCl (ALLEGRA ALLERGY PO) Take 1 Dose by mouth as needed.   finasteride (PROSCAR) 5 MG tablet Take 5 mg by mouth daily.   fluticasone (FLONASE) 50 MCG/ACT nasal spray Place 1 spray into both nostrils as needed.   lisinopril (ZESTRIL) 20 MG tablet Take 1 tablet (20 mg total) by mouth daily.    pantoprazole (PROTONIX) 40 MG tablet Take 40 mg by mouth daily.   rOPINIRole (REQUIP XL) 2 MG 24 hr tablet Take 1 tablet (2 mg total) by mouth at bedtime.   SODIUM FLUORIDE 5000 PPM 1.1 % PSTE Take 1.1 1e11 Vector [employer] by mouth See admin instructions.   tamsulosin (FLOMAX) 0.4 MG CAPS capsule Take 0.8 mg by mouth at bedtime.   traMADol (ULTRAM) 50 MG tablet Take 50 mg by mouth as needed.   [DISCONTINUED] lisinopril (ZESTRIL) 5 MG tablet Take 1 tablet by mouth daily. Patient took 10mg  this morning 12/21/2022     Allergies:   Patient has no known allergies.   ROS:   Please see the history of present illness.    All other systems reviewed and are negative.  EKGs/Labs/Other Studies Reviewed:    The following studies were reviewed today: Cardiac Studies & Procedures     STRESS TESTS  ECHOCARDIOGRAM STRESS TEST 02/13/2017  Narrative *[facility] [address] [place], [place] [address] [phone]  ------------------------------------------------------------------- Stress Echocardiography  Patient:    Randy Ramos, Randy Ramos MR #:       [id number] Study Date: 02/13/2017 Gender:     M Age:        89 Height:     175.3 cm Weight:     94 kg BSA:  2.17 m^2 Pt. Status: Room:  ATTENDING    Tonny Bollman, MD ORDERING     Tonny Bollman, MD REFERRING    Tonny Bollman, MD SONOGRAPHER  Clearence Ped, RCS PERFORMING   [facility] Outpatient  cc: Dr. Excell Seltzer  -------------------------------------------------------------------  ------------------------------------------------------------------- Indications:      SOB (R06.02).  ------------------------------------------------------------------- History:   PMH:  Hyperlipidemia.  Coronary artery disease.  Risk factors:  Hypertension.  ------------------------------------------------------------------- Study Conclusions  - Left ventricle: Mild LVH. Normal LVEF 55-60% at baseline. Normal wall motion. The study is not  technically sufficient to allow evaluation of LV diastolic function. - Aortic valve: Structurally normal valve. Trileaflet. - Mitral valve: Structurally normal valve. - Left atrium: The atrium was mildly dilated. - Right ventricle: The cavity size was normal. Wall thickness was normal. Systolic function was normal. - Right atrium: The atrium was normal in size. - Baseline ECG: Normal sinus rhythm. - Stress ECG conclusions: Sinus tachycardia without ischemic changes. - Baseline: LVEF 55-60%, normal wall thickening and motion. - Peak stress: Expected hyperdynamic increase in LVEF to 75-80%, normal wall motion and thickening. - Recovery: LVEF 60-65%, normal wall motion and thickening.  Impressions:  - No exercise-induced EKG or echocardiographic changes to suggest ischemia at given workload.  ------------------------------------------------------------------- Study data:   Study status:  Routine.  Consent:  The risks, benefits, and alternatives to the procedure were explained to the patient and informed consent was obtained.  Procedure:  The patient reported no pain pre or post test. Initial setup. The patient was brought to the laboratory. A baseline ECG was recorded. Surface ECG leads and automatic cuff blood pressure measurements were monitored. Treadmill exercise testing was performed using the Bruce protocol. The patient exercised for 9.3 min, to protocol stage 3, to a maximal work rate of 10.9 mets. Exercise was terminated due to achievement of target heart rate. The patient was positioned for image acquisition and recovery monitoring. Transthoracic stress echocardiography for SOB. Image quality was adequate. Images were captured at baseline and peak exercise.  Study completion:  The patient tolerated the procedure well. There were no complications. Bruce protocol. Stress echocardiography.  Birthdate: Patient birthdate: 1945/09/27.  Age:  Patient is 77 yr old.  Sex: Gender:  male.    BMI: 30.6 kg/m^2.  Blood pressure:     144/79 Patient status:  Outpatient.  Study date:  Study date: 02/13/2017. Study time: 07:41 AM.  -------------------------------------------------------------------  ------------------------------------------------------------------- Left ventricle:  Mild LVH. Normal LVEF 55-60% at baseline. Normal wall motion. The study is not technically sufficient to allow evaluation of LV diastolic function.  ------------------------------------------------------------------- Aortic valve:   Structurally normal valve. Trileaflet. Cusp separation was normal.  Doppler:  Transvalvular velocity was within the normal range. There was no stenosis. There was no regurgitation.    VTI ratio of LVOT to aortic valve: 0.99. Peak velocity ratio of LVOT to aortic valve: 0.84. Mean velocity ratio of LVOT to aortic valve: 0.96.    Mean gradient (S): 2 mm Hg. Peak gradient (S): 6 mm Hg.  ------------------------------------------------------------------- Mitral valve:   Structurally normal valve.   Leaflet separation was normal.  Doppler:  Transvalvular velocity was within the normal range. There was no evidence for stenosis. There was no regurgitation.  ------------------------------------------------------------------- Left atrium:  The atrium was mildly dilated.  ------------------------------------------------------------------- Atrial septum:  Poorly visualized.  ------------------------------------------------------------------- Right ventricle:  The cavity size was normal. Wall thickness was normal. Systolic function was normal.  ------------------------------------------------------------------- Right atrium:  The atrium was normal in size.  ------------------------------------------------------------------- Baseline  ECG:   Normal sinus rhythm.  ------------------------------------------------------------------- Stress  protocol:  +--------------------+---+-----------+--------+-------------------+ !Stage               !HR !BP (mmHg)  !Symptoms!Comments           ! +--------------------+---+-----------+--------+-------------------+ !Baseline            !63 !144/79     !None    !-------------------! !                    !   !(101)      !        !                   ! +--------------------+---+-----------+--------+-------------------+ !Stage 1             !89 !152/56 (88)!--------!RPE = 18, R/T leg  ! !                    !   !           !        !Fatigue.           ! +--------------------+---+-----------+--------+-------------------+ !Stage 2             !100!175/60 (98)!--------!-------------------! +--------------------+---+-----------+--------+-------------------+ !Stage 3             !829!562/13     !--------!-------------------! !                    !   !(121)      !        !                   ! +--------------------+---+-----------+--------+-------------------+ !Immediate post      !134!-----------!--------!-------------------! !stress              !   !           !        !                   ! +--------------------+---+-----------+--------+-------------------+ !Recovery; 1 min     !91 !206/41 (96)!--------!-------------------! +--------------------+---+-----------+--------+-------------------+ !Recovery; 2 min     !85 !-----------!--------!-------------------! +--------------------+---+-----------+--------+-------------------+ !Recovery; 3 min     !78 !191/52 (98)!--------!-------------------! +--------------------+---+-----------+--------+-------------------+ !Recovery; 4 min     !77 !-----------!--------!-------------------! +--------------------+---+-----------+--------+-------------------+ !Recovery; 5 min     !88 !129/44  (72)!--------!-------------------! +--------------------+---+-----------+--------+-------------------+  ------------------------------------------------------------------- Stress results:   Maximal heart rate during stress was 134 bpm (90% of maximal predicted heart rate). The maximal predicted heart rate was 149 bpm.The target heart rate was achieved. The heart rate response to stress was normal. There was a normal resting blood pressure with a hypertensive response to stress. The rate-pressure product for the peak heart rate and blood pressure was 08657 mm Hg/min.  ------------------------------------------------------------------- Stress ECG:  Sinus tachycardia without ischemic changes.  ------------------------------------------------------------------- Baseline:  Peak stress: Recovery:  ------------------------------------------------------------------- Measurements  LVOT                              Value LVOT peak velocity, S             103   cm/s LVOT mean velocity, S             67.5  cm/s LVOT VTI, S  21.9  cm  Aortic valve                      Value Aortic valve peak velocity, S     122   cm/s Aortic valve mean velocity, S     70.4  cm/s Aortic valve VTI, S               22.2  cm Aortic mean gradient, S           2     mm Hg Aortic peak gradient, S           6     mm Hg VTI ratio, LVOT/AV                0.99 Velocity ratio, peak, LVOT/AV     0.84 Velocity ratio, mean, LVOT/AV     0.96  Legend: (L)  and  (H)  mark values outside specified reference range.  ------------------------------------------------------------------- Prepared and Electronically Authenticated by  Zoila Shutter MD 2019-01-09T17:11:27   ECHOCARDIOGRAM  ECHOCARDIOGRAM COMPLETE 04/05/2020  Narrative ECHOCARDIOGRAM REPORT    Patient Name:   Randy Ramos Date of Exam: 04/05/2020 Medical Rec #:  [id number]     Height:       69.0 in Accession #:    [id number]     Weight:       189.0 lb Date of Birth:  01-23-1946     BSA:          2.017 m Patient Age:    74 years      BP:           120/66 mmHg Patient Gender: M             HR:           63 bpm. Exam Location:  [address]  Procedure: 2D Echo, 3D Echo, Cardiac Doppler, Color Doppler and Strain Analysis  Indications:    R07.9 Chest pain  History:        Patient has prior history of Echocardiogram examinations. CAD; Risk Factors:Dyslipidemia.  Sonographer:    Garald Braver, RDCS Referring Phys: [phone] Raahim Shartzer  IMPRESSIONS   1. Left ventricular ejection fraction, by estimation, is 60 to 65%. Left ventricular ejection fraction by PLAX is 59 %. The left ventricle has normal function. The left ventricle has no regional wall motion abnormalities. There is mild concentric left ventricular hypertrophy. Left ventricular diastolic parameters are consistent with Grade I diastolic dysfunction (impaired relaxation). Elevated left ventricular end-diastolic pressure. The average left ventricular global longitudinal strain is -20.5 %. The global longitudinal strain is normal. 2. Right ventricular systolic function is normal. The right ventricular size is normal. There is normal pulmonary artery systolic pressure. 3. The mitral valve is normal in structure. No evidence of mitral valve regurgitation. No evidence of mitral stenosis. 4. The aortic valve is tricuspid. Aortic valve regurgitation is not visualized. No aortic stenosis is present. 5. The inferior vena cava is normal in size with greater than 50% respiratory variability, suggesting right atrial pressure of 3 mmHg.  FINDINGS Left Ventricle: Left ventricular ejection fraction, by estimation, is 60 to 65%. Left ventricular ejection fraction by PLAX is 59 %. The left ventricle has normal function. The left ventricle has no regional wall motion abnormalities. The average left ventricular global longitudinal strain is -20.5 %. The global longitudinal strain  is normal. The left ventricular internal cavity size was normal in size. There is mild  concentric left ventricular hypertrophy. Left ventricular diastolic parameters are consistent with Grade I diastolic dysfunction (impaired relaxation). Elevated left ventricular end-diastolic pressure.  Right Ventricle: The right ventricular size is normal. No increase in right ventricular wall thickness. Right ventricular systolic function is normal. There is normal pulmonary artery systolic pressure. The tricuspid regurgitant velocity is 1.56 m/s, and with an assumed right atrial pressure of 3 mmHg, the estimated right ventricular systolic pressure is 12.7 mmHg.  Left Atrium: Left atrial size was normal in size.  Right Atrium: Right atrial size was normal in size.  Pericardium: There is no evidence of pericardial effusion.  Mitral Valve: The mitral valve is normal in structure. No evidence of mitral valve regurgitation. No evidence of mitral valve stenosis.  Tricuspid Valve: The tricuspid valve is normal in structure. Tricuspid valve regurgitation is trivial. No evidence of tricuspid stenosis.  Aortic Valve: The aortic valve is tricuspid. Aortic valve regurgitation is not visualized. No aortic stenosis is present.  Pulmonic Valve: The pulmonic valve was normal in structure. Pulmonic valve regurgitation is not visualized. No evidence of pulmonic stenosis.  Aorta: The aortic root is normal in size and structure.  Venous: The inferior vena cava is normal in size with greater than 50% respiratory variability, suggesting right atrial pressure of 3 mmHg.  IAS/Shunts: No atrial level shunt detected by color flow Doppler.   LEFT VENTRICLE PLAX 2D LV EF:         Left            Diastology ventricular     LV e' medial:    5.78 cm/s ejection        LV E/e' medial:  13.9 fraction by     LV e' lateral:   7.58 cm/s PLAX is 59      LV E/e' lateral: 10.6 %. LVIDd:         4.20 cm         2D LVIDs:          2.90 cm         Longitudinal LV PW:         1.10 cm         Strain LV IVS:        1.10 cm         2D Strain GLS  -20.4 % LVOT diam:     2.10 cm         (A2C): LV SV:         81              2D Strain GLS  -21.3 % LV SV Index:   40              (A3C): LVOT Area:     3.46 cm        2D Strain GLS  -19.6 % (A4C): 2D Strain GLS  -20.5 % Avg:  3D Volume EF: 3D EF:        60 % LV EDV:       164 ml LV ESV:       66 ml LV SV:        98 ml  RIGHT VENTRICLE RV Basal diam:  2.70 cm RV S prime:     12.80 cm/s TAPSE (M-mode): 2.6 cm RVSP:           12.7 mmHg  LEFT ATRIUM             Index  RIGHT ATRIUM           Index LA diam:        3.70 cm 1.83 cm/m  RA Pressure: 3.00 mmHg LA Vol (A2C):   39.1 ml 19.39 ml/m RA Area:     10.90 cm LA Vol (A4C):   29.0 ml 14.38 ml/m RA Volume:   20.40 ml  10.11 ml/m LA Biplane Vol: 33.5 ml 16.61 ml/m AORTIC VALVE LVOT Vmax:   106.00 cm/s LVOT Vmean:  70.400 cm/s LVOT VTI:    0.233 m  AORTA Ao Root diam: 3.50 cm Ao Asc diam:  3.20 cm  MITRAL VALVE               TRICUSPID VALVE TR Peak grad:   9.7 mmHg TR Vmax:        156.00 cm/s MV E velocity: 80.10 cm/s  Estimated RAP:  3.00 mmHg MV A velocity: 82.70 cm/s  RVSP:           12.7 mmHg MV E/A ratio:  0.97 SHUNTS Systemic VTI:  0.23 m Systemic Diam: 2.10 cm  Chilton Si MD Electronically signed by Chilton Si MD Signature Date/Time: 04/05/2020/6:51:36 PM    Final     CT SCANS  CT CORONARY MORPH W/CTA COR W/SCORE 05/25/2021  Addendum 05/25/2021  1:05 PM ADDENDUM REPORT: 05/25/2021 13:02  CLINICAL DATA:  Chest pain  EXAM: Cardiac/Coronary CTA  TECHNIQUE: A non-contrast, gated CT scan was obtained with axial slices of 3 mm through the heart for calcium scoring. Calcium scoring was performed using the Agatston method. A 120 kV prospective, gated, contrast cardiac scan was obtained. Gantry rotation speed was 250 msecs and collimation was 0.6 mm. Two sublingual  nitroglycerin tablets (0.8 mg) were given. The 3D data set was reconstructed in 5% intervals of the 35-75% of the R-R cycle. Diastolic phases were analyzed on a dedicated workstation using MPR, MIP, and VRT modes. The patient received 95 cc of contrast.  FINDINGS: Image quality: Excellent.  Noise artifact is: Limited.  Coronary Arteries:  Normal coronary origin.  Left dominance.  Left main: The left main is a large caliber vessel with a normal take off from the left coronary cusp that bifurcates to form a left anterior descending artery and a left circumflex artery. There is no plaque or stenosis.  Left anterior descending artery: The proximal LAD contains minimal calcified plaque (<25%). The mid LAD contains mild calcified plaque (25-49%). The distal LAD is patent. The LAD gives off 2 patent diagonal branches.  Left circumflex artery: The LCX is dominant and patent with no evidence of plaque or stenosis. The LCX gives off 3 patent obtuse marginal branches. The LCX terminates as a patent PDA.  Right coronary artery: The RCA is non-dominant with normal take off from the right coronary cusp. There is no evidence of plaque or stenosis.  Right Atrium: Right atrial size is within normal limits.  Right Ventricle: The right ventricular cavity is within normal limits.  Left Atrium: Left atrial size is normal in size with no left atrial appendage filling defect.  Left Ventricle: The ventricular cavity size is within normal limits.  Pulmonary arteries: Normal in size without proximal filling defect.  Pulmonary veins: Normal pulmonary venous drainage.  Pericardium: Normal thickness without significant effusion or calcium present.  Cardiac valves: The aortic valve is trileaflet without significant calcification. The mitral valve is normal without significant calcification.  Aorta: Normal caliber without significant disease.  Extra-cardiac findings: See attached radiology  report for  non-cardiac structures.  IMPRESSION: 1. Coronary calcium score of 71.4. This was 28th percentile for age-, sex, and race-matched controls.  2. Normal coronary origin with left dominance.  3. Mild calcified plaque (25-49%) in the mid LAD.  RECOMMENDATIONS: 1. Mild non-obstructive CAD (25-49%). Consider non-atherosclerotic causes of chest pain. Consider preventive therapy and risk factor modification.  Lennie Odor, MD   Electronically Signed By: Lennie Odor M.D. On: 05/25/2021 13:02  Narrative EXAM: OVER-READ INTERPRETATION  CT CHEST  The following report is an over-read performed by radiologist Dr. Trudie Reed of [facility] on 05/25/2021. This over-read does not include interpretation of cardiac or coronary anatomy or pathology. The coronary calcium score/coronary CTA interpretation by the cardiologist is attached.  COMPARISON:  None.  FINDINGS: Atherosclerotic calcifications in the thoracic aorta. Within the visualized portions of the thorax there are no suspicious appearing pulmonary nodules or masses, there is no acute consolidative airspace disease, no pleural effusions, no pneumothorax and no lymphadenopathy. Small hiatal hernia. Visualized portions of the upper abdomen are unremarkable. There are no aggressive appearing lytic or blastic lesions noted in the visualized portions of the skeleton.  IMPRESSION: 1.  Aortic Atherosclerosis (ICD10-I70.0). 2. Small hiatal hernia.  Electronically Signed: By: Trudie Reed M.D. On: 05/25/2021 09:17          EKG:        Recent Labs: 05/01/2022: TSH 2.780 09/25/2022: ALT 41; BUN 22; Creatinine 0.84; Hemoglobin 15.9; Platelet Count 233; Potassium 4.2; Sodium 133  Recent Lipid Panel No results found for: "CHOL", "TRIG", "HDL", "CHOLHDL", "VLDL", "LDLCALC", "LDLDIRECT"         Physical Exam:    VS:  BP (!) 168/82 (BP Location: Left Arm, Patient Position: Sitting, Cuff Size:  Large)   Pulse 68   Resp 16   Ht 5\' 9"  (1.753 m)   Wt 210 lb 6.4 oz (95.4 kg)   SpO2 98%   BMI 31.07 kg/m     Wt Readings from Last 3 Encounters:  12/21/22 210 lb 6.4 oz (95.4 kg)  11/01/22 204 lb (92.5 kg)  10/02/22 209 lb (94.8 kg)     GEN:  Well nourished, well developed in no acute distress HEENT: Normal NECK: No JVD; No carotid bruits LYMPHATICS: No lymphadenopathy CARDIAC: RRR, no murmurs, rubs, gallops RESPIRATORY:  Clear to auscultation without rales, wheezing or rhonchi  ABDOMEN: Soft, non-tender, non-distended MUSCULOSKELETAL:  No edema; No deformity  SKIN: Warm and dry NEUROLOGIC:  Alert and oriented x 3 PSYCHIATRIC:  Normal affect   Assessment & Plan Essential hypertension Blood pressure control is suboptimal.  Discussed sodium restriction, diet, regular exercise, and medication recommendations.  I recommended that he increase lisinopril to 20 mg daily and add amlodipine 5 mg daily.  He will record home blood pressure readings and send them in in 3 or 4 weeks.  He will take his antihypertensive medications in the morning. Shortness of breath Recommend an echocardiogram to evaluate for any cardiac changes with respect to his LVEF, RV function, or presence of diastolic dysfunction or valvular disease.  Suspect deconditioning is playing a role.  As part of his evaluation today, I reviewed a coronary CT angiogram from last year that showed nonobstructive CAD.  His last echo was reviewed from 2022 and demonstrated normal LVEF and normal RV function.  No significant valvular disease at that time.            Medication Adjustments/Labs and Tests Ordered: Current medicines are reviewed at length with the patient  today.  Concerns regarding medicines are outlined above.  Orders Placed This Encounter  Procedures   Basic metabolic panel   ECHOCARDIOGRAM COMPLETE   Meds ordered this encounter  Medications   lisinopril (ZESTRIL) 20 MG tablet    Sig: Take 1 tablet (20 mg  total) by mouth daily.    Dispense:  90 tablet    Refill:  3    Dose INCREASE   amLODipine (NORVASC) 5 MG tablet    Sig: Take 1 tablet (5 mg total) by mouth daily.    Dispense:  90 tablet    Refill:  3    Patient Instructions  Medication Instructions:  INCREASE Lisinopril to 20mg  daily START Amlodipine 5mg  daily *If you need a refill on your cardiac medications before your next appointment, please call your pharmacy*  Lab Work: BMET week prior to next appt If you have labs (blood work) drawn today and your tests are completely normal, you will receive your results only by: MyChart Message (if you have MyChart) OR A paper copy in the mail If you have any lab test that is abnormal or we need to change your treatment, we will call you to review the results.  Testing/Procedures: ECHO Your physician has requested that you have an echocardiogram. Echocardiography is a painless test that uses sound waves to create images of your heart. It provides your doctor with information about the size and shape of your heart and how well your heart's chambers and valves are working. This procedure takes approximately one hour. There are no restrictions for this procedure. Please do NOT wear cologne, perfume, aftershave, or lotions (deodorant is allowed). Please arrive 15 minutes prior to your appointment time.  Please note: We ask at that you not bring children with you during ultrasound (echo/ vascular) testing. Due to room size and safety concerns, children are not allowed in the ultrasound rooms during exams. Our front office staff cannot provide observation of children in our lobby area while testing is being conducted. An adult accompanying a patient to their appointment will only be allowed in the ultrasound room at the discretion of the ultrasound technician under special circumstances. We apologize for any inconvenience.  Follow-Up: At [facility], you and your health needs are our  priority.  As part of our continuing mission to provide you with exceptional heart care, we have created designated Provider Care Teams.  These Care Teams include your primary Cardiologist (physician) and Advanced Practice Providers (APPs -  Physician Assistants and Nurse Practitioners) who all work together to provide you with the care you need, when you need it.  Your next appointment:   6 month(s)  Provider:   Tonny Bollman, MD        Signed, Tonny Bollman, MD  12/21/2022 1:20 PM    [facility]

## 2022-12-21 NOTE — Patient Instructions (Signed)
Medication Instructions:  INCREASE Lisinopril to 20mg  daily START Amlodipine 5mg  daily *If you need a refill on your cardiac medications before your next appointment, please call your pharmacy*  Lab Work: BMET week prior to next appt If you have labs (blood work) drawn today and your tests are completely normal, you will receive your results only by: MyChart Message (if you have MyChart) OR A paper copy in the mail If you have any lab test that is abnormal or we need to change your treatment, we will call you to review the results.  Testing/Procedures: ECHO Your physician has requested that you have an echocardiogram. Echocardiography is a painless test that uses sound waves to create images of your heart. It provides your doctor with information about the size and shape of your heart and how well your heart's chambers and valves are working. This procedure takes approximately one hour. There are no restrictions for this procedure. Please do NOT wear cologne, perfume, aftershave, or lotions (deodorant is allowed). Please arrive 15 minutes prior to your appointment time.  Please note: We ask at that you not bring children with you during ultrasound (echo/ vascular) testing. Due to room size and safety concerns, children are not allowed in the ultrasound rooms during exams. Our front office staff cannot provide observation of children in our lobby area while testing is being conducted. An adult accompanying a patient to their appointment will only be allowed in the ultrasound room at the discretion of the ultrasound technician under special circumstances. We apologize for any inconvenience.  Follow-Up: At [facility], you and your health needs are our priority.  As part of our continuing mission to provide you with exceptional heart care, we have created designated Provider Care Teams.  These Care Teams include your primary Cardiologist (physician) and Advanced Practice Providers (APPs -   Physician Assistants and Nurse Practitioners) who all work together to provide you with the care you need, when you need it.  Your next appointment:   6 month(s)  Provider:   Tonny Bollman, MD

## 2023-01-01 ENCOUNTER — Ambulatory Visit (INDEPENDENT_AMBULATORY_CARE_PROVIDER_SITE_OTHER): Payer: PPO | Admitting: Neurology

## 2023-01-01 DIAGNOSIS — G4720 Circadian rhythm sleep disorder, unspecified type: Secondary | ICD-10-CM

## 2023-01-01 DIAGNOSIS — Z8669 Personal history of other diseases of the nervous system and sense organs: Secondary | ICD-10-CM

## 2023-01-01 DIAGNOSIS — G4733 Obstructive sleep apnea (adult) (pediatric): Secondary | ICD-10-CM | POA: Diagnosis not present

## 2023-01-01 DIAGNOSIS — G4734 Idiopathic sleep related nonobstructive alveolar hypoventilation: Secondary | ICD-10-CM

## 2023-01-07 NOTE — Procedures (Signed)
Physician Interpretation:     [facility] SPLIT NIGHT INTERPRETATION REPORT   STUDY DATE: 01/01/2023     PATIENT NAME:  Randy Ramos, Randy Ramos         DATE OF BIRTH:  18-Mar-1945  PATIENT ID:  [id number]    TYPE OF STUDY:  SPLIT  READING PHYSICIAN: Huston Foley, MD, PhD SCORING TECHNICIAN: Sheena Fields     Referred by: Shawnie Dapper, NP  History and Indication for Testing: 77 year old male with an underlying medical history of hypertension, hyperlipidemia, history of pericarditis, BPH, OSA, cervical radiculopathy, chronic neck and low back pain, who reports a longstanding history of restless leg symptoms. He is currently on ropinirole. He no longer takes tramadol or sertraline. He recently restarted using his older autoPAP machine. Height: 69.0 in Weight: 204 lb (BMI 30) Neck Size: 0.0  Medications: Peridex, Loprox, CoQ10, Zetia, Pepcid, Allegra, Proscar, Flonase, Xylocaine, Zestril, Protonix, Requip, Sodium Fluoride, Flomax,     DESCRIPTION: A sleep technologist was in attendance for the duration of the recording.  Data collection, scoring, video monitoring, and reporting were performed in compliance with the AASM Manual for the Scoring of Sleep and Associated Events; (Hypopnea is scored based on the criteria listed in Section VIII D. 1b in the AASM Manual V2.6 using a 4% oxygen desaturation rule or Hypopnea is scored based on the criteria listed in Section VIII D. 1a in the AASM Manual V2.6 using 3% oxygen desaturation and /or arousal rule).  A physician certified by the American Board of Sleep Medicine reviewed each epoch of the study.   FINDINGS:  Please refer to the attached summary for additional quantitative information.  STUDY DETAILS: Lights off was at 21:50: and lights on 04:54: (424 minutes hours in bed). This study was performed with an initial diagnostic portion followed by positive airway pressure titration.  DIAGNOSTIC ANALYSIS   SLEEP CONTINUITY AND  SLEEP ARCHITECTURE:  The diagnostic portion of the study began at 21:50 and ended at 02:15, for a recording time of 4h 25.85m minutes. The patient took ropinirole prior to onset of study. Total sleep time was 125 minutes minutes (75.7% supine;  24.3% lateral;  0.0% prone, 0.0% REM sleep), with a decreased sleep efficiency at 47.3%. Sleep latency was increased at 57.5 minutes. REM sleep was absent. Arousal index was 74.1 /hr. Of the total sleep time, the percentage of stage N1 sleep was 39.4%, which is markedly increased, stage N2 sleep was 57.0%, stage N3 sleep was 3.6%, and REM sleep was absent. Wake after sleep onset (WASO) time accounted for 82 minutes with moderate to severe sleep fragmentation noted.   AROUSAL (Baseline): There were 135.0 arousals in total, for an arousal index of 64.5 arousals/hour.  Of these, 130.0 were identified as respiratory-related arousals (62.2 /hr), 0 were PLM-related arousals (0.0 /hr), and 25 were non-specific arousals (12.0 /hr)   RESPIRATORY MONITORING:  Based on CMS criteria (using a 4% oxygen desaturation rule for scoring hypopneas), there were 84 apneas (81 obstructive; 0 central; 3 mixed), and 82 hypopneas.  Apnea index was 38.7. Hypopnea index was 30.1. The apnea-hypopnea index was 68.8/hour overall (63.6 supine; 0.0 REM, 0.0 supine REM). There were 0 respiratory effort-related arousals (RERAs).  The RERA index was 0.0 events/hr. Total respiratory disturbance index (RDI) was 68.8 events/hr. RDI results showed: supine RDI  84.0 /hr; non-supine RDI 21.6 /hr; REM RDI 0.0 /hr, supine REM RDI 0.0 /hr.  Based on AASM criteria (using a 3% oxygen desaturation and /or arousal rule  for scoring hypopneas), there were 84 apneas (81 obstructive; 0 central; 3 mixed), and 82 hypopneas.  Apnea index was 38.7. Hypopnea index was 31.6. The apnea-hypopnea index was 70.3 overall (65.0 supine; 0.0 REM, 0.0 supine REM). There were 0 respiratory effort-related arousals (RERAs). Total  respiratory disturbance index (RDI) was 70.3 events/hr. RDI results showed: supine RDI 85.9 /hr; non-supine RDI 21.6 /hr; REM RDI 0.0 /hr, supine REM RDI 0.0 /hr.   Respiratory events were associated with oxyhemoglobin desaturations (nadir 66%) from a normal baseline (mean 96%). Total time spent at, or below 88% was 28.0 minutes, or 22.3%  of total sleep time. Snoring was absent.      LIMB MOVEMENTS: There were 0 periodic limb movements of sleep (0.0/hr), of which 0 (0.0/hr) were associated with an arousal.     OXIMETRY: Total sleep time spent at, or below 88% was 12.1 minutes, or 9.7% of total sleep time. Snoring was classified as .     BODY POSITION: Duration of total sleep and percent of total sleep in their respective position is as follows: supine 95 minutes minutes (75.7%), non-supine 30.5 minutes (24.3%); right 29 minutes minutes (23.5%), left 01 minutes minutes (0.8%), and prone 00 minutes minutes (0.0%). Total supine REM sleep time was 00 minutes minutes (0.0% of total REM sleep).   Analysis of electrocardiogram activity showed the highest heart rate for the baseline portion of the study was 102.0 beats per minute.  The average heart rate during sleep was 70 bpm, while the highest heart rate for the same period was 83 bpm.    TREATMENT ANALYSIS SLEEP CONTINUITY AND SLEEP ARCHITECTURE:  The treatment portion of the study began at 02:15 and ended at 04:54, for a recording time of 2h 38.33m minutes.  Total sleep time was 129 minutes minutes (98.1% supine;  1.9% lateral; 0.0% prone, 3.5% REM sleep), with a decreased sleep efficiency at 81.4%. Sleep latency was decreased at 0.0 minutes. REM sleep latency was decreased at 13.5 minutes.  Arousal index was 27.9 /hr. Of the total sleep time, the percentage of stage N1 sleep was 16.7%, stage N3 sleep was 18.6%, and REM sleep was 3.5%, which is low. There were 1 Stage R periods observed during this portion of the study, 24 awakenings (i.e. transitions  to Stage W from any sleep stage), and 73.0 total stage transitions. Wake after sleep onset (WASO) time accounted for 29 minutes with sleep fragmentation ranging from mild to severe.     AROUSAL: There were 46.0 arousals in total, for an arousal index of 21.4 arousals/hour.  Of these, 17.0 were identified as respiratory-related arousals (7.9 /hr), 0 were PLM-related arousals (0.0 /hr), and 43 were non-specific arousals (20.0 /hr)    RESPIRATORY MONITORING:    While on PAP therapy, based on CMS criteria, the apnea-hypopnea index was 10.2 overall (10.2 supine; 0.5 REM).   While on PAP therapy, based on AASM criteria, the apnea-hypopnea index was 10.2 overall (10.2 supine; 0.5 REM).   Respiratory events were associated with oxyhemoglobin desaturation (nadir 86.0%) from a mean of 95.0%.  Total time spent at, or below 88% was 1.6 minutes, or 1.3%  of total sleep time.  Snoring was absent:  . There were 0.0 occurrences of Cheyne Stokes breathing. LIMB MOVEMENTS: There were 0 periodic limb movements of sleep (0.0/hr), of which 0 (0.0/hr) were associated with an arousal.     OXIMETRY: Total sleep time spent at, or below 88% was 1.2 minutes, or 0.9% of total sleep time.  BODY POSITION: Duration of total sleep and percent of total sleep in their respective position is as follows: supine 126 minutes minutes (98.1%), non-supine 2.5 minutes (1.9%); right 02 minutes minutes (1.9%), left 00 minutes minutes (0.0%), and prone 00 minutes minutes (0.0%). Total supine REM sleep time was 04 minutes minutes (100.0% of total REM sleep).   Analysis of electrocardiogram activity showed the highest heart rate for the treatment portion of the study was 89.0 beats per minute.  The average heart rate during sleep was 67 bpm, while the highest heart rate for the same period was 82 bpm.   TITRATION DETAILS (SEE ALSO TABLE AT THE END OF THE REPORT):    This patient has severe obstructive sleep apnea. The patient  qualified for an emergency split sleep study per AASM standards. The baseline AHI was 68.8/h, and O2 nadir 66%. The absence of REM sleep during the baseline portion of the study likely underestimated his sleep disordered breathing. The patient was shown several different interfaces and was subsequently fitted with a S/M full face mask from [employer]).  The patient was started on a pressure of 7 cm of water pressure without EPR (did not tolerate a lower starting pressure) and gradually titrated to a final titration pressure of 11 cm (with EPR of 2).  The AHI was improved but he achieved little sleep on the final pressure with a total sleep time of 8 minutes.  Residual AHI was 0/hour, O2 nadir of 88% during supine NREM sleep. Minimal supine REM sleep was achieved on CPAP of 7 cm.     EEG: Review of the EEG showed no abnormal electrical discharges and symmetrical bihemispheric findings.    EKG: The EKG revealed normal sinus rhythm (NSR). The average heart rate during sleep was 70 bpm during the baseline portion of the study and 67 bpm for the treatment portion of the study.   AUDIO/VIDEO REVIEW: The audio and video review did not show any abnormal or unusual behaviors, movements, phonations or vocalizations. The patient took 2 restroom breaks. Snoring was noted, ranging from milder to loud during the baseline portion of the study and improved with CPAP therapy.   POST-STUDY QUESTIONNAIRE: Post study, the patient indicated, that sleep was worse than usual.   IMPRESSION:    1. Severe Obstructive Sleep Apnea (OSA) with nocturnal hypoxemia. 2. Dysfunctions associated with sleep stages or arousal from sleep  RECOMMENDATIONS:    1. This patient has severe obstructive sleep apnea. The patient qualified for an emergency split sleep study per AASM standards. The baseline AHI was 68.8/h, and O2 nadir 66% (during supine NREM sleep). There was evidence of nocturnal hypoxemia (without PAP therapy). The absence of  REM sleep during the baseline portion of the study likely underestimated his sleep disordered breathing. The patient responded fairly well to CPAP therapy. I recommend home CPAP of 11 cm with EPR of 2 or as per patient preference, S/M Evora FFM from [employer]. The patient will be advised to be fully compliant with PAP therapy to improve sleep related symptoms and decrease long term cardiovascular risks. Please note, that untreated obstructive sleep apnea may carry additional perioperative morbidity. Patients with significant obstructive sleep apnea should receive perioperative PAP therapy and the surgeons and particularly the anesthesiologist should be informed of the diagnosis and the severity of the sleep disordered breathing. 2. This study shows sleep fragmentation and abnormal sleep stage percentages; these are nonspecific findings and per se do not signify an intrinsic sleep disorder or a  cause for the patient's sleep-related symptoms. Causes include (but are not limited to) the first night effect of the sleep study, circadian rhythm disturbances, medication effect or an underlying mood disorder or medical problem.  3. The patient should be cautioned not to drive, work at heights, or operate dangerous or heavy equipment when tired or sleepy. Review and reiteration of good sleep hygiene measures should be pursued with any patient. 4. The patient will be seen in follow-up in the sleep clinic at [facility] for discussion of the test results, symptom and treatment compliance review, further management strategies, etc. The referring provider will be notified of the test results.   I certify that I have reviewed the entire raw data recording prior to the issuance of this report in accordance with the Standards of Accreditation of the American Academy of Sleep Medicine (AASM).  Huston Foley, MD, PhD Medical Director, [facility]) Diplomat, ABPN (Neurology and Sleep)                   Technical Report:   [facility] Split Summary    General Information  Name: Randy Ramos, Randy Ramos BMI: 30.13 Physician: Huston Foley, MD  ID: [id number] Height: 69.0 in Technician: Margaretann Loveless, RPSGT  Sex: Male Weight: 204.0 lb Record: [id number]  Age: 66 [12-24-1945] Date: 01/01/2023     Medical & Medication History    11/01/22 ALL: JESSE MADREN is a 77 y.o. male here today for follow up for RLS. He was seen in consult with Dr [name] 04/2022 who advised to limit ropinirole to 4mg  daily and start gabapentin 300mg  daily around dinner. Since, he reports doing about the same. RLS continues to be his main concern. He reports taking ropinirole 1mg  at a time. He usually takes a total of 6mg  daily. He feels it works better when he takes 1mg  at 4p then will take 1mg  every hour or so until bedtime. He tried taking gabapentin 1-2 nights but wasn't sure it helped. Sleep aids make RLS worse so he is fearful of taking any other medications at night. He discontinued sertraline. He has a history of OSA but reported being unable to tolerate AutoPAP. Sleep study offered by Dr [name] at last visit but declined by patient. He did resume AutoPAP about 2-3 months ago. He is resting better and not snoring but does not feel RLS is any better when using autoPAP. His machine is 78-73 years old. 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 It does not seem to help. He was previously followed by Dr Wynetta Emery as well for neck pain. He has not had surgery as PT and ESIs were helpful. Dr Wynetta Emery switched him from pramipexole to ropinirole about a year ago. He feels that it does work better but continues to have symptoms. HISTORY (copied from Dr Teofilo Pod previous note) 77 year old male with an underlying medical history of hypertension, hyperlipidemia, history of pericarditis, BPH, OSA, cervical radiculopathy, chronic neck and low back pain, who  reports a longstanding history of restless leg symptoms. Symptoms date back to at least 20 years ago. In the past 10 years he has become worse. Symptoms have been treated by primary care and he has tried and failed several medications to help him sleep at night as well. He is restless at night, symptoms got worse when he tried hydroxyzine for sleep. His primary care tried him on levodopa which made him nauseated and  he even had vomiting on it. He may have tried trazodone as he recalls which made him sleepy but made his legs worse. He describes a sensation of restlessness, having to move his legs, twitching and then his legs and symptoms originally started in the evenings but have become worse to where the symptoms started in the late afternoon or early afternoon, sometimes all day and sometimes affect the whole body including upper body. He has a history of sleep apnea but has not been using his CPAP or AutoPap machine consistently he admits. He has been sleeping in a recliner.  Peridex, Loprox, CoQ10, Zetia, Pepcid, Allegra, Proscar, Flonase, Xylocaine, Zestril, Protonix, Requip, Sodium Fluoride, Flomax, Tramadol, Zoloft   Sleep Disorder      Comments   The patient came into the lab for a PSG. The patient took Requip prior to start of study. Per patient did not take Tramadol or Zoloft. The patient was split due to having an AHI greater than 40 after 2 hours of total sleep time. The patient was fitted with a [employer] Evora (FFM) size S/M. The mask was a good fit to face, and the patient tolerate the mask well. CPAP was started at [facility] with no EPR. The patient couldn't tolerate any lower pressure. CPAP was increased to 11cmH2O with EPR of 2. The patient did not achieve much sleep on this pressure setting. The patient had two restroom breaks. EKG showed no obvious cardiac arrhythmias. Moderate to loud snoring prior to CPAP placement. All sleep stages witnessed. Respiratory events scored with a 4% desat. Respiratory  events were stable while lateral. Majority of events during supine. Slept mostly supine. AHI was 69.8 after 2 hrs of TST.    Baseline Sleep Stage Information Baseline start time: 09:50:24 PM Baseline end time: 02:15:41 AM   Time Total Supine Side Prone Upright  Recording 4h 25.79m 3h 47.58m 0h 38.32m 0h 0.81m 0h 0.82m  Sleep 2h 5.40m 1h 35.32m 0h 30.60m 0h 0.43m 0h 0.33m   Latency N1 N2 N3 REM Onset Per. Slp. Eff.  Actual 0h 0.56m 0h 1.49m 3h 1.76m 0h 0.37m 0h 57.75m 2h 5.49m 47.27%   Stg Dur Wake N1 N2 N3 REM  Total 140.0 49.5 71.5 4.5 0.0  Supine 132.0 47.5 47.5 0.0 0.0  Side 8.0 2.0 24.0 4.5 0.0  Prone 0.0 0.0 0.0 0.0 0.0  Upright 0.0 0.0 0.0 0.0 0.0   Stg % Wake N1 N2 N3 REM  Total 52.7 39.4 57.0 3.6 0.0  Supine 49.7 37.8 37.8 0.0 0.0  Side 3.0 1.6 19.1 3.6 0.0  Prone 0.0 0.0 0.0 0.0 0.0  Upright 0.0 0.0 0.0 0.0 0.0    CPAP Sleep Stage Information CPAP start time: 02:15:41 AM CPAP end time: 04:54:05 AM   Time Total Supine Side Prone Upright  Recording (TRT) 2h 38.37m 2h 36.70m 0h 2.34m 0h 0.100m 0h 0.109m  Sleep (TST) 2h 9.49m 2h 6.71m 0h 2.70m 0h 0.37m 0h 0.83m   Latency N1 N2 N3 REM Onset Per. Slp. Eff.  Actual 0h 0.48m 0h 0.78m 0h 52.68m 0h 13.12m 0h 0.109m 0h 0.51m 81.39%   Stg Dur Wake N1 N2 N3 REM  Total 29.5 21.5 79.0 24.0 4.5  Supine 29.5 21.0 77.0 24.0 4.5  Side 0.0 0.5 2.0 0.0 0.0  Prone 0.0 0.0 0.0 0.0 0.0  Upright 0.0 0.0 0.0 0.0 0.0   Stg % Wake N1 N2 N3 REM  Total 18.6 16.7 61.2 18.6 3.5  Supine 18.6 16.3 59.7 18.6 3.5  Side  0.0 0.4 1.6 0.0 0.0  Prone 0.0 0.0 0.0 0.0 0.0  Upright 0.0 0.0 0.0 0.0 0.0    Baseline Respiratory Information Apnea Summary Sub Supine Side Prone Upright  Total 81 Total 81 80 1 0 0    REM 0 0 0 0 0    NREM 81 80 1 0 0  Obs 78 REM 0 0 0 0 0    NREM 78 77 1 0 0  Mix 3 REM 0 0 0 0 0    NREM 3 3 0 0 0  Cen 0 REM 0 0 0 0 0    NREM 0 0 0 0 0   Rera Summary Sub Supine Side Prone Upright  Total 0 Total 0 0 0 0 0    REM 0 0 0 0 0    NREM 0 0 0 0 0    Hypopnea Summary Sub Supine Side Prone Upright  Total 66 Total 66 56 10 0 0    REM 0 0 0 0 0    NREM 66 56 10 0 0   4% Hypopnea Summary Sub Supine Side Prone Upright  Total (4%) 63 Total 63 53 10 0 0    REM 0 0 0 0 0    NREM 63 53 10 0 0     AHI Total Obs Mix Cen  70.28 Apnea 38.73 37.29 1.43 0.00   Hypopnea 31.55 -- -- --  68.84 Hypopnea (4%) 30.12 -- -- --    Total Supine Side Prone Upright  Position AHI 70.28 85.89 21.64 0.00 0.00  REM AHI 0.00   NREM AHI 70.28   Position RDI 70.28 85.89 21.64 0.00 0.00  REM RDI 0.00   NREM RDI 70.28    4% Hypopnea Total Supine Side Prone Upright  Position AHI (4%) 68.84 84.00 21.64 0.00 0.00  REM AHI (4%) 0.00   NREM AHI (4%) 68.84   Position RDI (4%) 68.84 84.00 21.64 0.00 0.00  REM RDI (4%) 0.00   NREM RDI (4%) 68.84    CPAP Respiratory Information Apnea Summary Sub Supine Side Prone Upright  Total 3 Total 3 3 0 0 0    REM 0 0 0 0 0    NREM 3 3 0 0 0  Obs 3 REM 0 0 0 0 0    NREM 3 3 0 0 0  Mix 0 REM 0 0 0 0 0    NREM 0 0 0 0 0  Cen 0 REM 0 0 0 0 0    NREM 0 0 0 0 0   Rera Summary Sub Supine Side Prone Upright  Total 0 Total 0 0 0 0 0    REM 0 0 0 0 0    NREM 0 0 0 0 0   Hypopnea Summary Sub Supine Side Prone Upright  Total 19 Total 19 19 0 0 0    REM 1 1 0 0 0    NREM 18 18 0 0 0   4% Hypopnea Summary Sub Supine Side Prone Upright  Total (4%) 19 Total 19 19 0 0 0    REM 1 1 0 0 0    NREM 18 18 0 0 0     AHI Total Obs Mix Cen  10.23 Apnea 1.40 1.40 0.00 0.00   Hypopnea 8.84 -- -- --  10.23 Hypopnea (4%) 8.84 -- -- --    Total Supine Side Prone Upright  Position AHI 10.23 10.43 0.00 0.00 0.00  REM AHI 13.33   NREM AHI 10.12   Position RDI 10.23 10.43 0.00 0.00 0.00  REM RDI 13.33   NREM RDI 10.12    4% Hypopnea Total Supine Side Prone Upright  Position AHI (4%) 10.23 10.43 0.00 0.00 0.00  REM AHI (4%) 13.33   NREM AHI (4%) 10.12   Position RDI (4%) 10.23 10.43 0.00 0.00 0.00  REM RDI (4%) 13.33   NREM  RDI (4%) 10.12    Desaturation Information (Baseline)  <100% <90% <80% <70% <60% <50% <40%  Supine 203 119 1 0 0 0 0  Side 15 4 0 0 0 0 0  Prone 0 0 0 0 0 0 0  Upright 0 0 0 0 0 0 0  Total 218 123 1 0 0 0 0  Desaturation threshold setting: 3% Minimum desaturation setting: 10 seconds SaO2 nadir: 51% The longest event was a 50 sec obstructive Hypopneawith a minimum SaO2 of 89%. The lowest SaO2 was 76% associated with a 27 sec obstructive Apnea. Awakening/Arousal Information (Baseline) # of Awakenings 30  Wake after sleep onset 82.51m  Wake after persistent sleep 30.33m   Arousal Assoc. Arousals Index  Apneas 76 36.3  Hypopneas 54 25.8  Leg Movements 0 0.0  Snore 0.0 0.0  PTT Arousals 0 0.0  Spontaneous 25 12.0  Total 155 74.1   Desaturation Information (CPAP)  <100% <90% <80% <70% <60% <50% <40%  Supine 63 23 0 0 0 0 0  Side 0 0 0 0 0 0 0  Prone 0 0 0 0 0 0 0  Upright 0 0 0 0 0 0 0  Total 63 23 0 0 0 0 0  Desaturation threshold setting: 3% Minimum desaturation setting: 10 seconds SaO2 nadir: 85% The longest event was a 24 sec obstructive Hypopnea with a minimum SaO2 of 87%. The lowest SaO2 was 86% associated with a 14 sec obstructive Hypopnea. Awakening/Arousal Information (CPAP) # of Awakenings 24  Wake after sleep onset 29.9m  Wake after persistent sleep 29.67m   Arousal Assoc. Arousals Index  Apneas 3 1.4  Hypopneas 14 6.5  Leg Movements 0 0.0  Snore 0.0 0.0  PTT Arousals 0 0.0  Spontaneous 43 20.0  Total 60 27.9     EKG Rates (Baseline) EKG Avg Max Min  Awake 72 102 66  Asleep 70 83 64  EKG Events: Tachycardia Myoclonus Information (Baseline) PLMS LMs Index  Total LMs during PLMS 0 0.0  LMs w/ Microarousals 0 0.0   LM LMs Index  w/ Microarousal 0 0.0  w/ Awakening 0 0.0  w/ Resp Event 0 0.0  Spontaneous 0 0.0  Total 0 0.0   EKG Rates (CPAP) EKG Avg Max Min  Awake 72 89 65  Asleep 67 82 62  EKG Events: Tachycardia Myoclonus Information  (CPAP) PLMS LMs Index  Total LMs during PLMS 0 0.0  LMs w/ Microarousals 0 0.0   LM LMs Index  w/ Microarousal 0 0.0  w/ Awakening 0 0.0  w/ Resp Event 0 0.0  Spontaneous 0 0.0  Total 0 0.0      Titration Table:  [facility] CPAP/Bilevel Report    General Information  Name: Randy Ramos, Randy Ramos BMI: 30 Physician: Huston Foley   ID: [id number] Height: 69 in Technician: Margaretann Loveless  Sex: Male Weight: 204 lb Record: [id number]  Age: 67 [07-17-1945] Date: 01/01/2023 Scorer: Margaretann Loveless   Recommended Settings IPAP: N/A cmH20 EPAP: N/A cmH2O AHI: N/A AHI (4%):  N/A   Pressure IPAP/EPAP 00 07 08 10 11   O2 Vol 0.0 0.0 0.0 0.0 0.0  Time TRT 265.70m 28.31m 10.19m 99.14m 21.37m   TST 125.73m 28.70m 9.43m 84.57m 8.57m  Sleep Stage % Wake 52.8 1.8 10.0 15.1 61.9   % REM 0.0 16.1 0.0 0.0 0.0   % N1 39.2 12.5 27.8 12.4 68.8   % N2 57.2 71.4 72.2 59.2 31.3   % N3 3.6 0.0 0.0 28.4 0.0  Respiratory Total Events 147 3 7 12  0   Obs. Apn. 78 0 0 3 0   Mixed Apn. 3 0 0 0 0   Cen. Apn. 0 0 0 0 0   Hypopneas 66 3 7 9  0   AHI 70.56 6.43 46.67 8.52 0.00   Supine AHI 85.89 7.20 46.67 8.52 0.00   Prone AHI 0.00 0.00 0.00 0.00 0.00   Side AHI 22.00 0.00 0.00 0.00 0.00  Respiratory (4%) Hypopneas (4%) 63.00 3.00 7.00 9.00 0.00   AHI (4%) 69.12 6.43 46.67 8.52 0.00   Supine AHI (4%) 84.00 7.20 46.67 8.52 0.00   Prone AHI (4%) 0.00 0.00 0.00 0.00 0.00   Side AHI (4%) 22.00 0.00 0.00 0.00 0.00  Desat Profile <= 90% 54.55m 3.27m 1.20m 3.61m 0.73m   <= 80% 13.35m 0.39m 0.79m 0.69m 0.13m   <= 70% 13.31m 0.40m 0.9m 0.75m 0.38m   <= 60% 13.58m 0.51m 0.22m 0.4m 0.43m  Arousal Index Apnea 36.5 0.0 0.0 2.1 0.0   Hypopnea 25.9 4.3 33.3 5.0 0.0   LM 0.0 0.0 0.0 0.0 0.0   Spontaneous 12.0 8.6 20.0 19.9 60.0

## 2023-01-08 ENCOUNTER — Other Ambulatory Visit: Payer: Self-pay | Admitting: Family Medicine

## 2023-01-08 DIAGNOSIS — G4733 Obstructive sleep apnea (adult) (pediatric): Secondary | ICD-10-CM

## 2023-01-10 ENCOUNTER — Encounter: Payer: Self-pay | Admitting: *Deleted

## 2023-01-11 ENCOUNTER — Telehealth: Payer: Self-pay

## 2023-01-11 NOTE — Telephone Encounter (Signed)
Pt seen by Excell Seltzer on 12/21/22: His blood pressure has been elevated. They bring in readings and he is consistently running in the 150s over 80s. He has been taking his medications as instructed, currently on lisinopril 10 mg daily.  Blood pressure control is suboptimal. Discussed sodium restriction, diet, regular exercise, and medication recommendations. I recommended that he increase lisinopril to 20 mg daily and add amlodipine 5 mg daily. He will record home blood pressure readings and send them in in 3 or 4 weeks. He will take his antihypertensive medications in the morning.  Patient sent below log via MyChart:  Updates blood pressures after meds : Monday - 152/73-71 Tuesday-0900-163/85-  took med 0800  11:25- 155/73-81 Wednesday- 0940- 137/69-81  - good med at 0530 that day  Thursday - 11:30- 127/75-70   Took med 0830 Friday 0920-164/81-78  took med 0730 Saturday-0810- 156/80-70 took med 0500 12:30  153/75   Will route to MD for review. Overall, pressures are better, and thanksgiving week in here, so sodium restriction probably not followed closely. Should be have him continue to monitor for another week or so?

## 2023-01-11 NOTE — Telephone Encounter (Signed)
Completed in separate phone encounter 01/11/23.

## 2023-01-11 NOTE — Telephone Encounter (Signed)
Would increase amlodipine to 10 mg daily. thx

## 2023-01-14 ENCOUNTER — Encounter: Payer: Self-pay | Admitting: Hematology

## 2023-01-14 ENCOUNTER — Other Ambulatory Visit (HOSPITAL_COMMUNITY): Payer: Self-pay

## 2023-01-14 ENCOUNTER — Encounter: Payer: Self-pay | Admitting: Cardiovascular Disease

## 2023-01-14 MED ORDER — AMLODIPINE BESYLATE 10 MG PO TABS: 10.0000 mg | ORAL_TABLET | Freq: Every day | ORAL | 3 refills | Status: DC

## 2023-01-14 MED ORDER — AMLODIPINE BESYLATE 10 MG PO TABS
10.0000 mg | ORAL_TABLET | Freq: Every day | ORAL | 3 refills | Status: DC
  Filled 2023-01-14: qty 90, 90d supply, fill #0

## 2023-01-14 NOTE — Telephone Encounter (Signed)
Called and spoke to wife Consuella Lose on [place] (pt was outside working) and let her know the recommendation to increase Amlodipine from 5mg  to 10mg . She verbalized understanding. States she will use up what they have by doubling, then will go to pharmacy and get new rx (verified that she understood the new one would be 10mg  tabs and wouldn't need to take two). Pt will report BP's back to [place] after increase.

## 2023-01-28 ENCOUNTER — Ambulatory Visit (HOSPITAL_COMMUNITY): Payer: PPO | Attending: Cardiology

## 2023-01-28 DIAGNOSIS — R0602 Shortness of breath: Secondary | ICD-10-CM | POA: Insufficient documentation

## 2023-01-28 DIAGNOSIS — I10 Essential (primary) hypertension: Secondary | ICD-10-CM | POA: Diagnosis present

## 2023-01-28 LAB — ECHOCARDIOGRAM COMPLETE: S' Lateral: 2.68 cm

## 2023-02-05 LAB — BASIC METABOLIC PANEL WITH GFR
BUN/Creatinine Ratio: 19 (ref 10–24)
BUN: 17 mg/dL (ref 8–27)
CO2: 23 mmol/L (ref 20–29)
Calcium: 9.3 mg/dL (ref 8.6–10.2)
Chloride: 99 mmol/L (ref 96–106)
Creatinine, Ser: 0.88 mg/dL (ref 0.76–1.27)
Glucose: 145 mg/dL — ABNORMAL HIGH (ref 70–99)
Potassium: 4.2 mmol/L (ref 3.5–5.2)
Sodium: 136 mmol/L (ref 134–144)
eGFR: 89 mL/min/1.73 (ref >59)

## 2023-02-08 DIAGNOSIS — M25562 Pain in left knee: Secondary | ICD-10-CM | POA: Diagnosis not present

## 2023-02-11 DIAGNOSIS — M25562 Pain in left knee: Secondary | ICD-10-CM | POA: Diagnosis not present

## 2023-02-12 DIAGNOSIS — G4733 Obstructive sleep apnea (adult) (pediatric): Secondary | ICD-10-CM | POA: Diagnosis not present

## 2023-02-14 ENCOUNTER — Telehealth: Payer: Self-pay | Admitting: *Deleted

## 2023-02-14 ENCOUNTER — Telehealth: Payer: Self-pay | Admitting: Hematology

## 2023-02-14 DIAGNOSIS — M25562 Pain in left knee: Secondary | ICD-10-CM | POA: Diagnosis not present

## 2023-02-14 NOTE — Telephone Encounter (Signed)
 Rescheduled appointments per provider on call. Patients was made aware of changes via Voicemail and will be mailed an appointment reminder.

## 2023-02-14 NOTE — Telephone Encounter (Signed)
 I called pt, LMVM for him relating to appt scheduled on Monday.  He just started using new machime 02-01-2023, so the 2-3 month window needs to be later then Monday.  We also see for RLS. Did he want to come in for this RLS or wait for both issues.  If waits then appt Monday to be cancelled and made appt for 2-3 months from 02-01-2024.  (Prior to 05/06/2023) for cpap f/u. Pt to call back relating what he wants to do.

## 2023-02-18 ENCOUNTER — Encounter: Payer: Self-pay | Admitting: Neurology

## 2023-02-18 ENCOUNTER — Encounter: Payer: Self-pay | Admitting: Hematology

## 2023-02-18 ENCOUNTER — Ambulatory Visit: Payer: Self-pay | Admitting: Neurology

## 2023-02-18 ENCOUNTER — Other Ambulatory Visit: Payer: Self-pay | Admitting: *Deleted

## 2023-02-18 MED ORDER — ROPINIROLE HCL ER 2 MG PO TB24: 2.0000 mg | ORAL_TABLET | Freq: Every day | ORAL | 1 refills | Status: DC

## 2023-02-18 NOTE — Progress Notes (Deleted)
 Subjective:    Patient ID: Randy Ramos is a 78 y.o. male.  HPI {Common ambulatory SmartLinks:19316}  Review of Systems  Neurological:             Objective:  Neurological Exam  Physical Exam  Assessment:   ***  Plan:   ***

## 2023-02-18 NOTE — Progress Notes (Deleted)
 Patient in room #5 with his wife. Patient states his pain in both legs has gotten worse.

## 2023-02-18 NOTE — Progress Notes (Signed)
 Error, patient rescheduled for February for initial CPAP FU.

## 2023-02-19 ENCOUNTER — Telehealth: Payer: Self-pay | Admitting: *Deleted

## 2023-02-19 DIAGNOSIS — M25562 Pain in left knee: Secondary | ICD-10-CM | POA: Diagnosis not present

## 2023-02-19 DIAGNOSIS — G4733 Obstructive sleep apnea (adult) (pediatric): Secondary | ICD-10-CM

## 2023-02-19 NOTE — Telephone Encounter (Signed)
 Pt was in yesterday for appt which was rescheduled for cpap.  He mentioned that his mask/head gear he would feel like air leaking.  This is his download.

## 2023-02-19 NOTE — Telephone Encounter (Signed)
 Changes made in resmed. I also notified patient and sent the order to [facility].

## 2023-02-19 NOTE — Telephone Encounter (Signed)
 I recommend increasing the set pressure of 12 cm with EPR of 3. I put the order in.

## 2023-02-19 NOTE — Telephone Encounter (Signed)
 Zott, Linnell Fulling, Otilio Jefferson, RN; [place], [place] Got It Thank you

## 2023-02-21 DIAGNOSIS — R0981 Nasal congestion: Secondary | ICD-10-CM | POA: Diagnosis not present

## 2023-02-25 DIAGNOSIS — K080 Exfoliation of teeth due to systemic causes: Secondary | ICD-10-CM | POA: Diagnosis not present

## 2023-02-26 DIAGNOSIS — S83282D Other tear of lateral meniscus, current injury, left knee, subsequent encounter: Secondary | ICD-10-CM | POA: Diagnosis not present

## 2023-02-26 DIAGNOSIS — M1712 Unilateral primary osteoarthritis, left knee: Secondary | ICD-10-CM | POA: Diagnosis not present

## 2023-03-03 DIAGNOSIS — G4733 Obstructive sleep apnea (adult) (pediatric): Secondary | ICD-10-CM | POA: Diagnosis not present

## 2023-03-14 NOTE — Progress Notes (Deleted)
 No chief complaint on file.   HISTORY OF PRESENT ILLNESS:  03/14/23 ALL:  Randy Ramos returns for follow up for RLS and OSA on CPAP. PSG resutls 12/2022 showed "severe obstructive sleep apnea. The patient qualified for an emergency split sleep study per AASM standards. The baseline AHI was 68.8/h, and O2 nadir 66% (during supine NREM sleep). There was evidence of nocturnal hypoxemia (without PAP therapy). The absence of REM sleep during the baseline portion of the study likely underestimated his sleep disordered breathing." New CPAP ordered. Since,   I switched him to ropinirole XL 2mg  at bedtime at last visit 10/2024.  Gabapentin?  11/01/2022 ALL:  Randy Ramos is a 78 y.o. male here today for follow up for RLS. He was seen in consult with Dr [name] 04/2022 who advised to limit ropinirole to 4mg  daily and start gabapentin 300mg  daily around dinner. Since, he reports doing about the same. RLS continues to be his main concern. He reports taking ropinirole 1mg  at a time. He usually takes a total of 6mg  daily. He feels it works better when he takes 1mg  at 4p then will take 1mg  every hour or so until bedtime. He tried taking gabapentin 1-2 nights but wasn't sure it helped. Sleep aids make RLS worse so he is fearful of taking any other medications at night. He discontinued sertraline.   He has a history of OSA but reported being unable to tolerate AutoPAP. Sleep study offered by Dr [name] at last visit but declined by patient. He did resume AutoPAP about 2-3 months ago. He is resting better and not snoring but does not feel RLS is any better when using autoPAP. His machine is 10-72 years old.   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 It does not seem to help. He was previously followed by Dr Wynetta Emery as well for neck pain. He has not had surgery as PT and ESIs were helpful. Dr Wynetta Emery switched him from pramipexole to ropinirole about a year ago. He feels that it  does work better but continues to have symptoms.    HISTORY (copied from Dr Teofilo Pod previous note)  Dear Dr. Wynetta Emery,   I saw your patient, Randy Ramos, upon your kind request in my neurologic clinic today for initial consultation of his restless legs syndrome.  The patient is accompanied by his wife and daughter today.  As you know, Randy Ramos is a 78 year old male with an underlying medical history of hypertension, hyperlipidemia, history of pericarditis, BPH, OSA, cervical radiculopathy, chronic neck and low back pain, who reports a longstanding history of restless leg symptoms.  Symptoms date back to at least 20 years ago.  In the past 10 years he has become worse.  Symptoms have been treated by primary care and he has tried and failed several medications to help him sleep at night as well.  He is restless at night, symptoms got worse when he tried hydroxyzine for sleep.  His primary care tried him on levodopa which made him nauseated and he even had vomiting on it.  He may have tried trazodone as he recalls which made him sleepy but made his legs worse.  He describes a sensation of restlessness, having to move his legs, twitching and then his legs and symptoms originally started in the evenings but have become worse to where the symptoms started in the late afternoon or early afternoon, sometimes all day and sometimes affect the whole body  including upper body.  He has a history of sleep apnea but has not been using his CPAP or AutoPap machine consistently he admits.  He has been sleeping in a recliner.  He limits his caffeine to a third cup of regular coffee per day, otherwise decaf coffee, occasional soda, also decaf.  He drinks no alcohol, he is a non-smoker.  He hydrates well with water.  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  He lives with his wife, his daughter has started noticing some tiredness in her legs but no frank restless leg symptoms, his son has no symptoms, his older sister had some  restless leg symptoms.  He has been on sertraline, currently 25 mg daily to help him sleep at night, it is not helping.   I reviewed your office note from 03/15/2022.  He has a prescription for gabapentin but has not tried it.  He is currently on a high dose of ropinirole, 2 mg strength up to 6 mg daily, sometimes even more than that.  He takes it in half pill increments starting around 5 PM with several half pills taken until bedtime.     He had a MRI of the cervical spine without contrast through your office on 07/20/2021 and I reviewed the report: Impression: Multilevel degenerative changes as above with uncovertebral spurring and facet arthropathy at C5-C6 and C7-T1 resulting in severe left neural foraminal stenosis.  Moderate to severe right neuroforaminal stenosis at C3-C4 and moderate right neural foraminal stenosis and moderate spinal canal stenosis at C4-C6.  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  He does have the severe foraminal stenosis on the left at C7-T1 which could produce C8 radiculopathy.  On EMG, the C8 paraspinals did test normal but that could be due to to sparing of the cervicals paraspinals or early reinnervation.  However, there were some subtle findings that could also be consistent with lower trunk plexopathy which includes borderline low median sensory amplitudes left versus right along with the normal study of the cervical paraspinals.  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  Possibly a C8 nerve root block could add some diagnostic information.   REVIEW OF SYSTEMS: Out of a complete 14 system review of symptoms, the patient complains only of the following symptoms, neck pain, memory loss,  snoring, restless legs and all other reviewed systems are negative.   ALLERGIES: No Known Allergies   HOME MEDICATIONS: Outpatient Medications Prior to Visit  Medication Sig Dispense Refill   amLODipine (NORVASC) 10 MG tablet Take 1 tablet (10 mg total) by mouth daily. 90 tablet 3   chlorhexidine (PERIDEX) 0.12 % solution PRN     ciclopirox (LOPROX) 0.77 % cream Apply 0.77 % topically 2 (two) times daily.     Coenzyme Q10-Vitamin E (QUNOL ULTRA COQ10 PO) Take 1 capsule by mouth daily.     ezetimibe (ZETIA) 10 MG tablet Take 10 mg by mouth daily.  2   Fexofenadine HCl (ALLEGRA ALLERGY PO) Take 1 Dose by mouth as needed.     finasteride (PROSCAR) 5 MG tablet Take 5 mg by mouth daily.  3   fluticasone (FLONASE) 50 MCG/ACT nasal spray Place 1 spray into both nostrils as needed.  lisinopril (ZESTRIL) 20 MG tablet Take 1 tablet (20 mg total) by mouth daily. 90 tablet 3   pantoprazole (PROTONIX) 40 MG tablet Take 40 mg by mouth daily.     rOPINIRole (REQUIP XL) 2 MG 24 hr tablet Take 1 tablet (2 mg total) by mouth at bedtime. 90 tablet 1   SODIUM FLUORIDE 5000 PPM 1.1 % PSTE Take 1.1 1e11 [employer] by mouth See admin instructions.     tamsulosin (FLOMAX) 0.4 MG CAPS capsule Take 0.8 mg by mouth at bedtime.  3   traMADol (ULTRAM) 50 MG tablet Take 50 mg by mouth as needed.     No facility-administered medications prior to visit.     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  01/2010 LHC with minimal nonobstructive CAD b.qualifier: Diagnosis of  By: Manson Passey, RN, BSN, Lauren   BPH (benign prostatic hyperplasia)    Cataract    Essential (primary) hypertension    GERD (gastroesophageal reflux disease)    Mixed hyperlipidemia    Has expereinced some intolerance to statins in the past   Pericarditis    Restless leg syndrome    Sleep apnea      PAST SURGICAL HISTORY: No past surgical history on file.   FAMILY HISTORY: Family  History  Problem Relation Age of Onset   Hypertension Mother    Esophageal cancer Father    Hypertension Father    Restless legs syndrome Sister    Colon cancer Neg Hx    Rectal cancer Neg Hx    Stomach cancer Neg Hx    Colon polyps Neg Hx      SOCIAL HISTORY: Social History   Socioeconomic History   Marital status: Married    Spouse name: Not on file   Number of children: Not on file   Years of education: Not on file   Highest education level: Not on file  Occupational History   Not on file  Tobacco Use   Smoking status: Never   Smokeless tobacco: Former  Advertising account planner   Vaping status: Never Used  Substance and Sexual Activity   Alcohol use: No   Drug use: Never   Sexual activity: Not on file  Other Topics Concern   Not on file  Social History Narrative   Not on file   Social Drivers of Health   Financial Resource Strain: Not on file  Food Insecurity: Low Risk  (01/10/2023)   Received from [facility]   Hunger Vital Sign    Worried About Running Out of Food in the Last Year: Never true    Ran Out of Food in the Last Year: Never true  Transportation Needs: No Transportation Needs (01/10/2023)   Received from [facility]    In the past 12 months, has lack of reliable transportation kept you from medical appointments, meetings, work or from getting things needed for daily living? : No  Physical Activity: Not on file  Stress: Not on file  Social Connections: Not on file  Intimate Partner Violence: Not on file     PHYSICAL EXAM  There were no vitals filed for this visit.  There is no height or weight on file to calculate BMI.  Generalized: Well developed, in no acute distress  Cardiology: normal rate and rhythm, no murmur auscultated  Respiratory: clear to auscultation bilaterally    Neurological examination  Mentation: Alert oriented to time, place, history taking. Follows  all commands speech and language fluent Cranial nerve II-XII:  Pupils were equal round reactive to light. Extraocular movements were full, visual field were full on confrontational test. Facial sensation and strength were normal. Uvula tongue midline. Head turning and shoulder shrug  were normal and symmetric. Motor: The motor testing reveals 5 over 5 strength of all 4 extremities. Good symmetric motor tone is noted throughout.  Gait and station: Gait is normal.    DIAGNOSTIC DATA (LABS, IMAGING, TESTING) - I reviewed patient records, labs, notes, testing and imaging myself where available.  Lab Results  Component Value Date   WBC 9.4 09/25/2022   HGB 15.9 09/25/2022   HCT 46.1 09/25/2022   MCV 92.0 09/25/2022   PLT 233 09/25/2022      Component Value Date/Time   NA 136 02/04/2023 1604   K 4.2 02/04/2023 1604   CL 99 02/04/2023 1604   CO2 23 02/04/2023 1604   GLUCOSE 145 (H) 02/04/2023 1604   GLUCOSE 104 (H) 09/25/2022 1115   BUN 17 02/04/2023 1604   CREATININE 0.88 02/04/2023 1604   CREATININE 0.84 09/25/2022 1115   CALCIUM 9.3 02/04/2023 1604   PROT 7.1 09/25/2022 1115   ALBUMIN 4.3 09/25/2022 1115   AST 39 09/25/2022 1115   ALT 41 09/25/2022 1115   ALKPHOS 74 09/25/2022 1115   BILITOT 1.4 (H) 09/25/2022 1115   GFRNONAA >60 09/25/2022 1115   No results found for: "CHOL", "HDL", "LDLCALC", "LDLDIRECT", "TRIG", "CHOLHDL" Lab Results  Component Value Date   HGBA1C 6.3 (H) 05/01/2022   Lab Results  Component Value Date   VITAMINB12 479 09/25/2022   Lab Results  Component Value Date   TSH 2.780 05/01/2022        No data to display               No data to display           ASSESSMENT AND PLAN  78 y.o. year old male  has a past medical history of ANGINA, STABLE (01/11/2010), BPH (benign prostatic hyperplasia), Cataract, Essential (primary) hypertension, GERD (gastroesophageal reflux disease), Mixed hyperlipidemia, Pericarditis, Restless leg syndrome, and Sleep apnea. here with    OSA on CPAP  RLS (restless  legs syndrome)  Randy Ramos reports RLS continues to be a concern. We will try him on ropinirole XR 2mg  daily around 4pm. He may take ropinirole IR up to 2mg  at bedtime, as needed. He is aware not to exceed a total of 4mg  daily. He may resume gabapentin 300mg  at bedtime per PCP as well. We will order PSG for eval of OSA and RLS. Healthy lifestyle habits encouraged. He will follow up with PCP as directed. He will return to see Dr [name] in 3-4 months, sooner if needed. He verbalizes understanding and agreement with this plan.    No orders of the defined types were placed in this encounter.    No orders of the defined types were placed in this encounter.   I spent 30 minutes of face-to-face and non-face-to-face time with patient.  This included previsit chart review, lab review, study review, order entry, electronic health record documentation, patient education.   Shawnie Dapper, MSN, FNP-C 03/14/2023, 4:50 PM  [facility] [address], [address] [place], [place] [address] [phone]

## 2023-03-18 ENCOUNTER — Telehealth: Payer: Self-pay | Admitting: Neurology

## 2023-03-18 ENCOUNTER — Ambulatory Visit: Payer: PPO | Admitting: Hematology

## 2023-03-18 ENCOUNTER — Other Ambulatory Visit: Payer: PPO

## 2023-03-18 ENCOUNTER — Encounter: Payer: Self-pay | Admitting: Family Medicine

## 2023-03-18 NOTE — Telephone Encounter (Signed)
 Pt's wife, Zacherie Lagunes rescheduled appointment due to potential bad weather.

## 2023-03-20 ENCOUNTER — Encounter: Payer: Medicare Other | Admitting: Family Medicine

## 2023-03-20 DIAGNOSIS — G4733 Obstructive sleep apnea (adult) (pediatric): Secondary | ICD-10-CM

## 2023-03-20 DIAGNOSIS — G2581 Restless legs syndrome: Secondary | ICD-10-CM

## 2023-03-21 ENCOUNTER — Encounter: Payer: Self-pay | Admitting: Family Medicine

## 2023-03-25 ENCOUNTER — Ambulatory Visit (INDEPENDENT_AMBULATORY_CARE_PROVIDER_SITE_OTHER): Payer: Medicare Other | Admitting: Neurology

## 2023-03-25 ENCOUNTER — Encounter: Payer: Self-pay | Admitting: Neurology

## 2023-03-25 VITALS — BP 130/68 | HR 65 | Ht 69.0 in | Wt 213.0 lb

## 2023-03-25 DIAGNOSIS — G2581 Restless legs syndrome: Secondary | ICD-10-CM

## 2023-03-25 DIAGNOSIS — D508 Other iron deficiency anemias: Secondary | ICD-10-CM | POA: Diagnosis not present

## 2023-03-25 DIAGNOSIS — G4733 Obstructive sleep apnea (adult) (pediatric): Secondary | ICD-10-CM

## 2023-03-25 NOTE — Patient Instructions (Signed)
It was nice to see you again today.  Here is what we discussed today: We will increase your CPAP to 13 cm at this time for better apnea control.  Sleeping better and better sleep apnea control will result in improvement of your restless leg syndrome as well. Please limit yourself to ropinirole long-acting 2 mg once daily at 4 PM and restart your gabapentin 300 mg about an hour before bedtime, phase out of immediate release ropinirole by reducing to half a pill once in the evening for 2 weeks and then stop altogether. Stay better hydrated with water, 6 to 8 cups/day are recommended, 8 ounce size each throughout the day. Limit caffeine to up to 2 servings per day.

## 2023-03-25 NOTE — Progress Notes (Signed)
Subjective:    Patient ID: Randy Ramos is a 78 y.o. male.  HPI    Interim history:   Randy Ramos is a 78 year old male with an underlying medical history of hypertension, hyperlipidemia, history of pericarditis, BPH, OSA, cervical radiculopathy, chronic neck and low back pain, who presents for follow-up consultation of his RLS and OSA.  The patient is accompanied by his wife today.  I first met him at the request of his neurosurgeon on 05/01/2022 at which time he reported a longstanding history of restless leg syndrome.  He was advised to start adjunct gabapentin in low-dose and limit his ropinirole to up to 4 mg each day.  He was offered a sleep study but he declined at the time.  He saw  Randy Ramos, Randy Ramos in September 2024, at which time he was advised to resume taking gabapentin 300 mg at bedtime and limit his ropinirole IR and XR to up to a total of 4 mg.  He was given a prescription for ropinirole  XR 2 mg strength.  A sleep study was ordered.  He had a split-night sleep study through our sleep lab on 01/01/2023 which showed severe obstructive sleep apnea with a baseline AHI of 68.8/h, O2 nadir 66% during supine non-REM sleep.  He had absence of REM sleep during the baseline portion of the study.  He was advised to start home CPAP of 11 cm.  His set up date was 01/31/23. He has a ResMed air sense 11 AutoSet machine.  His DME provider is [facility].   Today, 03/25/2023: I reviewed his CPAP compliance data from 02/20/2023 through 03/21/2023, which is a total of 30 days, during which time he used his machine 27 days with percent use days greater than 4 hours at 87%, indicating very good compliance with an average usage of 5 hours and 26 minutes, residual AHI elevated at 27/h, leak acceptable with the 95th percentile at 7 L/min on a pressure of 12 cm with EPR of 3.  I had originally recommended a home CPAP of 13 cm but he was initially started on 11 cm and we increased it subsequently in January 2025 to 12 cm. He  reports tolerating CPAP, he wonders if the pressure needs to be increased.  He still has restless leg symptoms, takes Requip XL 2 mg at 4 PM and 2 mg IR half a pill in the evening, another half at bedtime and sometimes a third half later on.  He is no longer taking gabapentin, tried it a couple of times and admits that it may not have been long enough.  He is willing to get back on gabapentin.  He has a prescription for Requip IR from another prescriber and has some leftover pills.  He does not always hydrate well with water, drinks about 2 to 3 cups of coffee which is 2/3 caffeine and one third decaf and also drinks tea, mostly decaf as they make it at home.   The patient's allergies, current medications, family history, past medical history, past social history, past surgical history and problem list were reviewed and updated as appropriate.   Previously:  11/01/2022 Randy Ramos, Randy Ramos): << Randy Ramos is a 78 y.o. male here today for follow up for RLS. He was seen in consult with Dr [name] 04/2022 who advised to limit ropinirole to 4mg  daily and start gabapentin 300mg  daily around dinner. Since, he reports doing about the same. RLS continues to be his main concern. He reports taking  ropinirole 1mg  at a time. He usually takes a total of 6mg  daily. He feels it works better when he takes 1mg  at 4p then will take 1mg  every hour or so until bedtime. He tried taking gabapentin 1-2 nights but wasn't sure it helped. Sleep aids make RLS worse so he is fearful of taking any other medications at night. He discontinued sertraline.    He has a history of OSA but reported being unable to tolerate AutoPAP. Sleep study offered by Dr [name] at last visit but declined by patient. He did resume AutoPAP about 2-3 months ago. He is resting better and not snoring but does not feel RLS is any better when using autoPAP. His machine is 36-92 years old.    He is followed by hematology for low ferratin. He has had two infusions, last  infusion 8/20. It does not seem to help. He was previously followed by Dr Wynetta Emery as well for neck pain. He has not had surgery as PT and ESIs were helpful. Dr Wynetta Emery switched him from pramipexole to ropinirole about a year ago. He feels that it does work better but continues to have symptoms. >>  05/01/2022 (SA): (He) reports a longstanding history of restless leg symptoms.  Symptoms date back to at least 20 years ago.  In the past 10 years he has become worse.  Symptoms have been treated by primary care and he has tried and failed several medications to help him sleep at night as well.  He is restless at night, symptoms got worse when he tried hydroxyzine for sleep.  His primary care tried him on levodopa which made him nauseated and he even had vomiting on it.  He may have tried trazodone as he recalls which made him sleepy but made his legs worse.  He describes a sensation of restlessness, having to move his legs, twitching and then his legs and symptoms originally started in the evenings but have become worse to where the symptoms started in the late afternoon or early afternoon, sometimes all day and sometimes affect the whole body including upper body.  He has a history of sleep apnea but has not been using his CPAP or AutoPap machine consistently he admits.  He has been sleeping in a recliner.  He limits his caffeine to a third cup of regular coffee per day, otherwise decaf coffee, occasional soda, also decaf.  He drinks no alcohol, he is a non-smoker.  He hydrates well with water.  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  He lives with his wife, his daughter has started noticing some tiredness in her legs but no frank restless leg symptoms, his son has no symptoms, his older sister had some restless leg symptoms.  He has been on sertraline, currently 25 mg daily to help him sleep at night, it is not helping.   I reviewed your office note from 03/15/2022.  He has a prescription for gabapentin but has not  tried it.  He is currently on a high dose of ropinirole, 2 mg strength up to 6 mg daily, sometimes even more than that.  He takes it in half pill increments starting around 5 PM with several half pills taken until bedtime.     He had a MRI of the cervical spine without contrast through your office on 07/20/2021 and I reviewed the report: Impression: Multilevel degenerative changes as above with uncovertebral spurring and facet arthropathy at C5-C6 and C7-T1 resulting in severe left neural foraminal stenosis.  Moderate to severe right neuroforaminal stenosis at C3-C4 and moderate right neural foraminal stenosis and moderate spinal canal stenosis at C4-C6.  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  He does have the severe foraminal stenosis on the left at C7-T1 which could produce C8 radiculopathy.  On EMG, the C8 paraspinals did test normal but that could be due to to sparing of the cervicals paraspinals or early reinnervation.  However, there were some subtle findings that could also be consistent with lower trunk plexopathy which includes borderline low median sensory amplitudes left versus right along with the normal study of the cervical paraspinals.  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  Possibly a C8 nerve root block could add some diagnostic information.     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  01/2010 LHC with minimal nonobstructive CAD b.qualifier: Diagnosis of  By: Manson Passey, RN, BSN, Lauren   BPH (benign prostatic hyperplasia)    Cataract    Essential (primary) hypertension    GERD (gastroesophageal reflux disease)     Mixed hyperlipidemia    Has expereinced some intolerance to statins in the past   Pericarditis    Restless leg syndrome    Sleep apnea     His Past Surgical History Is Significant For: History reviewed. No pertinent surgical history.  His Family History Is Significant For: Family History  Problem Relation Age of Onset   Hypertension Mother    Esophageal cancer Father    Hypertension Father    Restless legs syndrome Sister    Colon cancer Neg Hx    Rectal cancer Neg Hx    Stomach cancer Neg Hx    Colon polyps Neg Hx     His Social History Is Significant For: Social History   Socioeconomic History   Marital status: Married    Spouse name: Not on file   Number of children: Not on file   Years of education: Not on file   Highest education level: Not on file  Occupational History   Not on file  Tobacco Use   Smoking status: Never   Smokeless tobacco: Former  Advertising account planner   Vaping status: Never Used  Substance and Sexual Activity   Alcohol use: No   Drug use: Never   Sexual activity: Not on file  Other Topics Concern   Not on file  Social History Narrative   Not on file   Social Drivers of Health   Financial Resource Strain: Not on file  Food Insecurity: Low Risk  (01/10/2023)   Received from [facility]   Hunger Vital Sign    Worried About Running Out of Food in the Last Year: Never true    Ran Out of Food in the Last Year: Never true  Transportation Needs: No Transportation Needs (01/10/2023)   Received from [facility]    In the past 12 months, has lack of reliable transportation kept you from medical appointments, meetings, work or from getting things needed for daily living? : No  Physical Activity: Not on file  Stress: Not on file  Social Connections: Not on file    His Allergies Are:  No Known Allergies:   His Current Medications Are:  Outpatient Encounter Medications as of 03/25/2023  Medication Sig   amLODipine (NORVASC) 10 MG  tablet Take 1 tablet (10 mg total) by mouth daily.   chlorhexidine (PERIDEX) 0.12 % solution PRN   ciclopirox (LOPROX) 0.77 % cream Apply 0.77 % topically 2 (two) times daily.   Coenzyme Q10-Vitamin E (QUNOL ULTRA COQ10 PO) Take 1 capsule by mouth daily.   ezetimibe (ZETIA) 10 MG tablet Take 10 mg by mouth daily.   Fexofenadine HCl (ALLEGRA ALLERGY PO) Take 1 Dose by mouth as needed.   finasteride (PROSCAR) 5 MG tablet Take 5 mg by mouth daily.   fluticasone (FLONASE) 50 MCG/ACT nasal spray Place 1 spray into both nostrils as needed.   lisinopril (ZESTRIL) 20 MG tablet Take 1 tablet (20 mg total) by mouth daily.   pantoprazole (PROTONIX) 40 MG tablet Take 40 mg by mouth daily.   rOPINIRole (REQUIP XL) 2 MG 24 hr tablet Take 1 tablet (2 mg total) by mouth at bedtime.   SODIUM FLUORIDE 5000 PPM 1.1 % PSTE Take 1.1 1e11 [employer] by mouth See admin instructions.   tamsulosin (FLOMAX) 0.4 MG CAPS capsule Take 0.8 mg by mouth at bedtime.   traMADol (ULTRAM) 50 MG tablet Take 50 mg by mouth as needed.   No facility-administered encounter medications on file as of 03/25/2023.  :  Review of Systems:  Out of a complete 14 point review of systems, all are reviewed and negative with the exception of these symptoms as listed below:   Review of Systems  Neurological:        Patient in room #5 with his wife. Patient states he doing well but still has issues with his restless legs.    Objective:  Neurological Exam  Physical Exam Physical Examination:   Vitals:   03/25/23 0920  BP: 130/68  Pulse: 65    General Examination: The patient is a very pleasant 78 y.o. male in no acute distress. He appears well-developed and well-nourished and well groomed.   HEENT: Normocephalic, atraumatic, pupils are equal, round and reactive to light, extraocular tracking is good without limitation to gaze excursion or nystagmus noted. Hearing is grossly intact. Face is symmetric with normal facial animation.  Speech is clear with no dysarthria noted. There is no hypophonia. There is no lip, neck/head, jaw or voice tremor. Neck is supple with full range of passive and active motion. There are no carotid bruits on auscultation. Oropharynx exam reveals: Mild mouth dryness, adequate dental hygiene and moderate airway crowding.  Tongue protrudes centrally and palate elevates symmetrically.   Chest: Clear to auscultation without wheezing, rhonchi or crackles noted.   Heart: S1+S2+0, regular and normal without murmurs, rubs or gallops noted.    Abdomen: Soft, non-tender and non-distended.   Extremities: There is no pitting edema in the distal lower extremities bilaterally.    Skin: Warm and dry without trophic changes noted.    Musculoskeletal: exam reveals no obvious joint deformities.    Neurologically:  Mental status: The patient is awake, alert and oriented in all 4 spheres. His immediate and remote memory, attention, language skills and fund of knowledge are appropriate. There is no evidence of aphasia, agnosia, apraxia or anomia. Speech is clear with normal prosody and enunciation. Thought process is linear. Mood is normal and affect is normal.  Cranial nerves II - XII are as described above under HEENT exam.  Motor exam: Normal bulk, strength and tone is  noted. There is no obvious action or resting tremor. Fine motor skills and coordination: grossly intact.  Cerebellar testing: No dysmetria or intention tremor. There is no truncal or gait ataxia.  Sensory exam: intact to light touch in the upper and lower extremities.  Gait, station and balance: He stands easily. No veering to one side is noted. No leaning to one side is noted. Posture is age-appropriate and stance is narrow based. Gait shows normal stride length and normal pace. No problems turning are noted.    Assessment and plan:  In summary, Randy Ramos is a very pleasant 78 year old male with an underlying medical history of hypertension,  hyperlipidemia, history of pericarditis, BPH, OSA, cervical radiculopathy, chronic neck and low back pain, who presents for follow-up consultation of his restless leg syndrome of many years' duration, as well as his severe obstructive sleep apnea.  His split-night sleep study from November 2024 showed a baseline AHI of 68.8/h, O2 nadir 66%.  He had absence of REM sleep during the baseline portion of the study and did reasonably well on CPAP of 13 cm.  He is currently compliant with his CPAP of 12 cm and is agreeable to increasing the pressure to 13 cm.  We may have to increase it further.  We may consider a designated titration study at a later date.  As far as his restless legs, we had another long discussion today about augmentation.  He is probably going through some symptoms of augmentation at this time.  He is on a relatively high dose of ropinirole of at least 4 mg total dose currently and I again advised him to limit his ropinirole.  At this juncture, he is advised to take only ropinirole long-acting 2 mg at 4 PM and restart his gabapentin 300 mg about an hour before bedtime, days out of the IR altogether by reducing it to half a pill once in the evening for about 2 weeks and then stop the IR formulation altogether.  We talked about the importance of staying well-hydrated and limiting caffeine which can also help.  Also, optimizing his sleep apnea control can further help his restless leg symptoms.  He is compliant with his CPAP but AHI is in the moderate range at this time.  He is commended for his treatment adherence.  He is advised to follow-up routinely in this office in about 6 months to see Randy Ramos, Randy Ramos again.  He is encouraged to keep [place] posted via MyChart messaging or phone call in the interim.  Of note, ferritin level was quite low about a year ago but has since then improved, after iron infusion.  He is followed by hematology.  I answered all their questions today and the patient and his wife are  in agreement. I spent 45 minutes in total face-to-face time and in reviewing records during pre-charting, more than 50% of which was spent in counseling and coordination of care, reviewing test results, reviewing medications and treatment regimen and/or in discussing or reviewing the diagnosis of OSA, RLS, the prognosis and treatment options. Pertinent laboratory and imaging test results that were available during this visit with the patient were reviewed by me and considered in my medical decision making (see chart for details).

## 2023-03-25 NOTE — Progress Notes (Signed)
RE: increase pressure to 13 Received: Today Zott, Hennie Duos, RN; Melvern Sample Got It Thank You       Previous Messages    ----- Message ----- From: Guy Begin, RN Sent: 03/25/2023  10:00 AM EST To: Melvern Sample; Stacy Zott Subject: increase pressure to 13                        New order in epic.  I did increase in airview.  Randy Ramos Male, 78 y.o., 13-Feb-1945 MRN: [id number] Randy Ramos

## 2023-03-26 ENCOUNTER — Encounter: Payer: Self-pay | Admitting: Hematology

## 2023-03-29 ENCOUNTER — Ambulatory Visit (HOSPITAL_BASED_OUTPATIENT_CLINIC_OR_DEPARTMENT_OTHER): Admit: 2023-03-29 | Payer: PPO | Admitting: Orthopedic Surgery

## 2023-03-29 ENCOUNTER — Encounter (HOSPITAL_BASED_OUTPATIENT_CLINIC_OR_DEPARTMENT_OTHER): Payer: Self-pay

## 2023-03-29 SURGERY — ARTHROSCOPY, KNEE, WITH MEDIAL MENISCECTOMY
Anesthesia: Choice | Site: Knee | Laterality: Left

## 2023-04-02 ENCOUNTER — Other Ambulatory Visit: Payer: Self-pay

## 2023-04-02 ENCOUNTER — Inpatient Hospital Stay: Payer: Medicare Other | Admitting: Hematology

## 2023-04-02 ENCOUNTER — Inpatient Hospital Stay: Payer: Medicare Other | Attending: Internal Medicine

## 2023-04-02 VITALS — BP 131/62 | HR 63 | Temp 97.9°F | Resp 18 | Wt 213.6 lb

## 2023-04-02 DIAGNOSIS — D509 Iron deficiency anemia, unspecified: Secondary | ICD-10-CM

## 2023-04-02 DIAGNOSIS — Z79899 Other long term (current) drug therapy: Secondary | ICD-10-CM | POA: Insufficient documentation

## 2023-04-02 DIAGNOSIS — E611 Iron deficiency: Secondary | ICD-10-CM | POA: Diagnosis not present

## 2023-04-02 LAB — CBC WITH DIFFERENTIAL (CANCER CENTER ONLY)
Abs Immature Granulocytes: 0.03 10*3/uL (ref 0.00–0.07)
Basophils Absolute: 0.1 10*3/uL (ref 0.0–0.1)
Basophils Relative: 1 %
Eosinophils Absolute: 0.3 10*3/uL (ref 0.0–0.5)
Eosinophils Relative: 5 %
HCT: 40.9 % (ref 39.0–52.0)
Hemoglobin: 14.1 g/dL (ref 13.0–17.0)
Immature Granulocytes: 0 %
Lymphocytes Relative: 37 %
Lymphs Abs: 2.8 10*3/uL (ref 0.7–4.0)
MCH: 31.5 pg (ref 26.0–34.0)
MCHC: 34.5 g/dL (ref 30.0–36.0)
MCV: 91.5 fL (ref 80.0–100.0)
Monocytes Absolute: 0.8 10*3/uL (ref 0.1–1.0)
Monocytes Relative: 11 %
Neutro Abs: 3.5 10*3/uL (ref 1.7–7.7)
Neutrophils Relative %: 46 %
Platelet Count: 293 10*3/uL (ref 150–400)
RBC: 4.47 MIL/uL (ref 4.22–5.81)
RDW: 11.4 % — ABNORMAL LOW (ref 11.5–15.5)
WBC Count: 7.5 10*3/uL (ref 4.0–10.5)
nRBC: 0 % (ref 0.0–0.2)

## 2023-04-02 LAB — CMP (CANCER CENTER ONLY)
ALT: 29 U/L (ref 0–44)
AST: 23 U/L (ref 15–41)
Albumin: 4.4 g/dL (ref 3.5–5.0)
Alkaline Phosphatase: 84 U/L (ref 38–126)
Anion gap: 6 (ref 5–15)
BUN: 14 mg/dL (ref 8–23)
CO2: 28 mmol/L (ref 22–32)
Calcium: 9.4 mg/dL (ref 8.9–10.3)
Chloride: 102 mmol/L (ref 98–111)
Creatinine: 0.79 mg/dL (ref 0.61–1.24)
GFR, Estimated: >60 mL/min (ref >60)
Glucose, Bld: 108 mg/dL — ABNORMAL HIGH (ref 70–99)
Potassium: 4.1 mmol/L (ref 3.5–5.1)
Sodium: 136 mmol/L (ref 135–145)
Total Bilirubin: 1.2 mg/dL (ref 0.0–1.2)
Total Protein: 7 g/dL (ref 6.5–8.1)

## 2023-04-02 LAB — IRON AND IRON BINDING CAPACITY (CC-WL,HP ONLY)
Iron: 87 ug/dL (ref 45–182)
Saturation Ratios: 28 % (ref 17.9–39.5)
TIBC: 316 ug/dL (ref 250–450)
UIBC: 229 ug/dL (ref 117–376)

## 2023-04-02 LAB — FERRITIN: Ferritin: 174 ng/mL (ref 24–336)

## 2023-04-02 LAB — VITAMIN B12: Vitamin B-12: 343 pg/mL (ref 180–914)

## 2023-04-02 NOTE — Progress Notes (Signed)
 HEMATOLOGY/ONCOLOGY CLINIC NOTE  Date of Service: 04/02/2023  Patient Care Team: Hadley Pen, MD as PCP - General (Family Medicine) Tonny Bollman, MD as PCP - Cardiology (Cardiology)  CHIEF COMPLAINTS/PURPOSE OF CONSULTATION:  Mx of IDA   HISTORY OF PRESENTING ILLNESS:   Randy Ramos is a wonderful 78 y.o. male who has been referred to [place] by Molly Maduro A. Sherral Hammers, MD for evaluation and management of low ferritin levels related to iron deficiency without anemia.   Today, he is accompanied by his wife. He reports that this is the first time he has been iron deficient. He reports that his iron work-up was triggered by his restless leg syndrome.  Patient denies any nose bleeds, gum bleeds, black stools, blood in stool/urine, sudden weight changes, or recent surgeries. He denies any dietary restrictions/intolerances or previous bowel surgeries that may have caused absorption issues.  He reports that his stomach has been sensitive for a while and denies any stomach ulcers.  Patient denies any GI issues besides acid reflux. He has taken Protonix for several years and does also take Pepsid.  Patient last received a colonoscopy in 2020 and an endoscopy 1 year ago. There was no concern for ulcers at the time. He does note previous benign polyps. Patient denies any recent stool testing.  He does complain of frequent pain in his abdomen which he describes as a knot that radiates to his back. He believes the pain is triggered by foods. He denies any previous hernia.   Patient does endorse worsened memory function.   He complains of worsened restless legs syndrome over the last few months. He has had this disease for approximately 20 years. Patient describes that symptoms begin in his bilateral legs and radiate throughout his body. His symptoms do cause sleep disturbances. He reports that his symptoms are especially bothersome when he sits/lays down. He reports that medications such as  Mirapex did previously improve symptoms, but did not resolve. He denies any previous concern for neuropathy. He reports that caffeine and strong medications does worsens symptoms. Patient has tried essential oils and tonic water to manage restless legs. He also reports taking Gabapentin for two days, which worsened restless leg symptoms. He has not tried taking Benedryl to manage restless less. Patient does endorse a lack of energy, which he attributes to restless leg. Patient is concerned as his restless leg syndrome does limit his social life. Patient does not have a sleep doctor. He is unsure if his thyroid functions have been previously checked.   Patient was diagnosed with sleep apnea 5-6 years ago but does not currently use a CPAP machine. His weight has been fluctuating between 180-210 pounds over the last 5-6 years.   Patient regularly stays active but denies any strenuous exercise. His wife reports that he is slightly SOB when walking. Patient denies any back pain, weight changes, change in bowel habits, or abdominal pain.  Patient has been taking 45 MG p.o. iron tablets for two weeks, which causes abdominal pain.   In regards to his diet, patient consumes meat approximately once a week. Patient does regularly drink coffee in the mornings and drinks sodas occasionally.    INTERVAL HISTORY:  Randy Ramos is a wonderful 78 y.o. male who is here for continued evaluation and management of low ferritin levels related to iron deficiency without anemia.  Patient was last seen by me on 09/19/2022 and he complained of restless leg syndrome, sleep apnea, and occasional acid reflux.  Patient is accompanied by his wife during this visit. Patient notes he has been doing well overall since our last visit. He notes that his restless leg syndrome is the same, which occasionally worsens at night.   He denies any new infection issues, fever, chills, night sweats, unexpected weight loss, back pain, chest  pain, abdominal pain, or leg swelling. He does complain of mild fatigue, but not severe.   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  01/2010 LHC with minimal nonobstructive CAD b.qualifier: Diagnosis of  By: Manson Passey, RN, BSN, Lauren   BPH (benign prostatic hyperplasia)    Cataract    Essential (primary) hypertension    GERD (gastroesophageal reflux disease)    Mixed hyperlipidemia    Has expereinced some intolerance to statins in the past   Pericarditis    Restless leg syndrome    Sleep apnea     SURGICAL HISTORY: No past surgical history on file.  SOCIAL HISTORY: Social History   Socioeconomic History   Marital status: Married    Spouse name: Not on file   Number of children: Not on file   Years of education: Not on file   Highest education level: Not on file  Occupational History   Not on file  Tobacco Use   Smoking status: Never   Smokeless tobacco: Former  Advertising account planner   Vaping status: Never Used  Substance and Sexual Activity   Alcohol use: No   Drug use: Never   Sexual activity: Not on file  Other Topics Concern   Not on file  Social History Narrative   Not on file   Social Drivers of Health   Financial Resource Strain: Not on file  Food Insecurity: Low Risk  (01/10/2023)   Received from [facility]   Hunger Vital Sign    Worried About Running Out of Food in the Last Year: Never true    Ran Out of Food in the Last Year: Never true  Transportation Needs: No Transportation Needs (01/10/2023)   Received from [facility]    In the past 12 months, has lack of reliable transportation kept you from medical appointments, meetings, work or from getting things needed for daily living? : No  Physical Activity: Not on file  Stress: Not on file  Social Connections: Not on file  Intimate Partner Violence: Not on file    FAMILY HISTORY: Family History  Problem Relation Age of Onset   Hypertension  Mother    Esophageal cancer Father    Hypertension Father    Restless legs syndrome Sister    Colon cancer Neg Hx    Rectal cancer Neg Hx    Stomach cancer Neg Hx    Colon polyps Neg Hx     ALLERGIES:  has no known allergies.  MEDICATIONS:  Current Outpatient Medications  Medication Sig Dispense Refill   amLODipine (NORVASC) 10 MG tablet Take 1 tablet (10 mg total) by mouth daily. 90 tablet 3   chlorhexidine (PERIDEX) 0.12 % solution PRN     ciclopirox (LOPROX) 0.77 % cream Apply 0.77 % topically 2 (two) times daily.     Coenzyme Q10-Vitamin E (QUNOL ULTRA COQ10 PO) Take 1 capsule by mouth daily.     ezetimibe (ZETIA) 10 MG tablet Take 10 mg by mouth daily.  2   Fexofenadine HCl (ALLEGRA ALLERGY PO) Take 1 Dose by mouth as needed.     finasteride (PROSCAR)  5 MG tablet Take 5 mg by mouth daily.  3   fluticasone (FLONASE) 50 MCG/ACT nasal spray Place 1 spray into both nostrils as needed.     lisinopril (ZESTRIL) 20 MG tablet Take 1 tablet (20 mg total) by mouth daily. 90 tablet 3   pantoprazole (PROTONIX) 40 MG tablet Take 40 mg by mouth daily.     rOPINIRole (REQUIP XL) 2 MG 24 hr tablet Take 1 tablet (2 mg total) by mouth at bedtime. 90 tablet 1   SODIUM FLUORIDE 5000 PPM 1.1 % PSTE Take 1.1 1e11 [employer] by mouth See admin instructions.     tamsulosin (FLOMAX) 0.4 MG CAPS capsule Take 0.8 mg by mouth at bedtime.  3   traMADol (ULTRAM) 50 MG tablet Take 50 mg by mouth as needed.     No current facility-administered medications for this visit.    REVIEW OF SYSTEMS:    10 Point review of Systems was done is negative except as noted above.  PHYSICAL EXAMINATION: ECOG PERFORMANCE STATUS: 1 - Symptomatic but completely ambulatory  . Vitals:   04/02/23 1000  BP: 131/62  Pulse: 63  Resp: 18  Temp: 97.9 F (36.6 C)  SpO2: 96%   Filed Weights   04/02/23 1000  Weight: 213 lb 9.6 oz (96.9 kg)   .Body mass index is 31.54 kg/m.  GENERAL:alert, in no acute distress  and comfortable SKIN: no acute rashes, no significant lesions EYES: conjunctiva are pink and non-injected, sclera anicteric OROPHARYNX: MMM, no exudates, no oropharyngeal erythema or ulceration NECK: supple, no JVD LYMPH:  no palpable lymphadenopathy in the cervical, axillary or inguinal regions LUNGS: clear to auscultation b/l with normal respiratory effort HEART: regular rate & rhythm ABDOMEN:  normoactive bowel sounds , non tender, not distended. No hepatosplenomegaly. Extremity: no pedal edema PSYCH: alert & oriented x 3 with fluent speech NEURO: no focal motor/sensory deficits  LABORATORY DATA:  I have reviewed the data as listed  Iron panel 05/07/2022:    Marland Kitchen    Latest Ref Rng & Units 04/02/2023    9:11 AM 09/25/2022   11:15 AM 07/19/2022    2:36 PM  CBC  WBC 4.0 - 10.5 K/uL 7.5  9.4  8.7   Hemoglobin 13.0 - 17.0 g/dL 40.9  81.1  91.4   Hematocrit 39.0 - 52.0 % 40.9  46.1  44.8   Platelets 150 - 400 K/uL 293  233  248.0     .    Latest Ref Rng & Units 04/02/2023    9:11 AM 02/04/2023    4:04 PM 09/25/2022   11:15 AM  CMP  Glucose 70 - 99 mg/dL 782  956  213   BUN 8 - 23 mg/dL 14  17  22    Creatinine 0.61 - 1.24 mg/dL 0.86  5.78  4.69   Sodium 135 - 145 mmol/L 136  136  133   Potassium 3.5 - 5.1 mmol/L 4.1  4.2  4.2   Chloride 98 - 111 mmol/L 102  99  103   CO2 22 - 32 mmol/L 28  23  24    Calcium 8.9 - 10.3 mg/dL 9.4  9.3  9.4   Total Protein 6.5 - 8.1 g/dL 7.0   7.1   Total Bilirubin 0.0 - 1.2 mg/dL 1.2   1.4   Alkaline Phos 38 - 126 U/L 84   74   AST 15 - 41 U/L 23   39   ALT 0 - 44 U/L 29  41    . Lab Results  Component Value Date   IRON 87 04/02/2023   TIBC 316 04/02/2023   IRONPCTSAT 28 04/02/2023   (Iron and TIBC)  Lab Results  Component Value Date   FERRITIN 174 04/02/2023      RADIOGRAPHIC STUDIES: I have personally reviewed the radiological images as listed and agreed with the findings in the report. No results found.  ASSESSMENT & PLAN:    Wonderful 78 y.o. male with:  Severe Iron deficiency without significant anemia but with severe restless leg syndrome. Gerd Hypertension Cervial radiculopathy Osteoarthritis BPH Hyperlipidemia Restless leg syndrome Depression   PLAN: -Discussed lab results from today, 04/02/2023, in detail with the patient. CBC and CMP stable.  Iron labs adequate with ferritin of 174 and iron saturation of 28%.  - no indication for additional IV Iron replacement at this time. Replacement of iron deficiency does not seem to have helped with patients RLS -Answered all of patient's questions.   FOLLOW-UP: RTC with Dr Candise Che with labs in 12 months  The total time spent in the appointment was 20 minutes* .  All of the patient's questions were answered with apparent satisfaction. The patient knows to call the clinic with any problems, questions or concerns.   Wyvonnia Lora MD MS AAHIVMS [facility] Hematology/Oncology Physician [facility]  .*Total Encounter Time as defined by the Centers for Medicare and Medicaid Services includes, in addition to the face-to-face time of a patient visit (documented in the note above) non-face-to-face time: obtaining and reviewing outside history, ordering and reviewing medications, tests or procedures, care coordination (communications with other health care professionals or caregivers) and documentation in the medical record.   I,Param Shah,acting as a Neurosurgeon for Wyvonnia Lora, MD.,have documented all relevant documentation on the behalf of Wyvonnia Lora, MD,as directed by  Wyvonnia Lora, MD while in the presence of Wyvonnia Lora, MD.  .I have reviewed the above documentation for accuracy and completeness, and I agree with the above. Johney Maine MD

## 2023-04-03 ENCOUNTER — Telehealth: Payer: Self-pay | Admitting: Hematology

## 2023-04-03 DIAGNOSIS — G4733 Obstructive sleep apnea (adult) (pediatric): Secondary | ICD-10-CM | POA: Diagnosis not present

## 2023-04-03 NOTE — Telephone Encounter (Signed)
 Spoke with patient confirming upcoming appointment

## 2023-04-05 ENCOUNTER — Encounter (HOSPITAL_BASED_OUTPATIENT_CLINIC_OR_DEPARTMENT_OTHER): Payer: Self-pay | Admitting: Orthopedic Surgery

## 2023-04-08 ENCOUNTER — Encounter: Payer: Self-pay | Admitting: Hematology

## 2023-04-12 ENCOUNTER — Ambulatory Visit (HOSPITAL_BASED_OUTPATIENT_CLINIC_OR_DEPARTMENT_OTHER): Payer: Self-pay | Admitting: Certified Registered"

## 2023-04-12 ENCOUNTER — Encounter (HOSPITAL_BASED_OUTPATIENT_CLINIC_OR_DEPARTMENT_OTHER)
Admission: RE | Disposition: A | Discharge status: Home / Self Care | Payer: Self-pay | Source: Home / Self Care | Attending: Orthopedic Surgery

## 2023-04-12 ENCOUNTER — Ambulatory Visit (HOSPITAL_BASED_OUTPATIENT_CLINIC_OR_DEPARTMENT_OTHER)
Admission: RE | Admit: 2023-04-12 | Discharge: 2023-04-12 | Disposition: A | Discharge status: Home / Self Care | Payer: Medicare Other | Attending: Orthopedic Surgery | Admitting: Orthopedic Surgery

## 2023-04-12 ENCOUNTER — Encounter (HOSPITAL_BASED_OUTPATIENT_CLINIC_OR_DEPARTMENT_OTHER): Payer: Self-pay | Admitting: Orthopedic Surgery

## 2023-04-12 ENCOUNTER — Other Ambulatory Visit: Payer: Self-pay

## 2023-04-12 DIAGNOSIS — Z01818 Encounter for other preprocedural examination: Secondary | ICD-10-CM

## 2023-04-12 DIAGNOSIS — M94262 Chondromalacia, left knee: Secondary | ICD-10-CM | POA: Diagnosis not present

## 2023-04-12 DIAGNOSIS — M659 Unspecified synovitis and tenosynovitis, unspecified site: Secondary | ICD-10-CM | POA: Insufficient documentation

## 2023-04-12 DIAGNOSIS — G4733 Obstructive sleep apnea (adult) (pediatric): Secondary | ICD-10-CM

## 2023-04-12 DIAGNOSIS — G4730 Sleep apnea, unspecified: Secondary | ICD-10-CM | POA: Diagnosis not present

## 2023-04-12 DIAGNOSIS — S83232A Complex tear of medial meniscus, current injury, left knee, initial encounter: Secondary | ICD-10-CM

## 2023-04-12 DIAGNOSIS — S83242A Other tear of medial meniscus, current injury, left knee, initial encounter: Secondary | ICD-10-CM | POA: Insufficient documentation

## 2023-04-12 DIAGNOSIS — I10 Essential (primary) hypertension: Secondary | ICD-10-CM

## 2023-04-12 DIAGNOSIS — E782 Mixed hyperlipidemia: Secondary | ICD-10-CM | POA: Diagnosis not present

## 2023-04-12 DIAGNOSIS — S83282A Other tear of lateral meniscus, current injury, left knee, initial encounter: Secondary | ICD-10-CM | POA: Diagnosis not present

## 2023-04-12 HISTORY — PX: KNEE ARTHROSCOPY WITH MEDIAL MENISECTOMY: SHX5651

## 2023-04-12 SURGERY — ARTHROSCOPY, KNEE, WITH MEDIAL MENISCECTOMY
Anesthesia: General | Site: Knee | Laterality: Left

## 2023-04-12 MED ORDER — EPHEDRINE SULFATE (PRESSORS) 50 MG/ML IJ SOLN
INTRAMUSCULAR | Status: DC | PRN
  Administered 2023-04-12 (×3): 5 mg via INTRAVENOUS
  Administered 2023-04-12: 10 mg via INTRAVENOUS

## 2023-04-12 MED ORDER — ACETAMINOPHEN 500 MG PO TABS
1000.0000 mg | ORAL_TABLET | Freq: Once | ORAL | Status: AC
  Administered 2023-04-12: 1000 mg via ORAL

## 2023-04-12 MED ORDER — PROPOFOL 10 MG/ML IV BOLUS
INTRAVENOUS | Status: DC | PRN
  Administered 2023-04-12: 150 mg via INTRAVENOUS

## 2023-04-12 MED ORDER — FENTANYL CITRATE (PF) 100 MCG/2ML IJ SOLN
INTRAMUSCULAR | Status: AC
  Filled 2023-04-12: qty 2

## 2023-04-12 MED ORDER — ONDANSETRON HCL 4 MG/2ML IJ SOLN
INTRAMUSCULAR | Status: AC
  Filled 2023-04-12: qty 2

## 2023-04-12 MED ORDER — DEXAMETHASONE SODIUM PHOSPHATE 10 MG/ML IJ SOLN
INTRAMUSCULAR | Status: DC | PRN
  Administered 2023-04-12: 5 mg via INTRAVENOUS

## 2023-04-12 MED ORDER — ACETAMINOPHEN 500 MG PO TABS
ORAL_TABLET | ORAL | Status: AC
  Filled 2023-04-12: qty 2

## 2023-04-12 MED ORDER — LACTATED RINGERS IV SOLN: INTRAVENOUS | Status: DC

## 2023-04-12 MED ORDER — ONDANSETRON HCL 4 MG/2ML IJ SOLN
INTRAMUSCULAR | Status: DC | PRN
  Administered 2023-04-12: 4 mg via INTRAVENOUS

## 2023-04-12 MED ORDER — BUPIVACAINE-EPINEPHRINE (PF) 0.5% -1:200000 IJ SOLN
INTRAMUSCULAR | Status: AC
Start: 2023-04-12 — End: ?
  Filled 2023-04-12: qty 30

## 2023-04-12 MED ORDER — LIDOCAINE 2% (20 MG/ML) 5 ML SYRINGE
INTRAMUSCULAR | Status: AC
  Filled 2023-04-12: qty 5

## 2023-04-12 MED ORDER — AMISULPRIDE (ANTIEMETIC) 5 MG/2ML IV SOLN: 10.0000 mg | Freq: Once | INTRAVENOUS | Status: DC | PRN

## 2023-04-12 MED ORDER — CEFAZOLIN SODIUM-DEXTROSE 2-4 GM/100ML-% IV SOLN
INTRAVENOUS | Status: AC
  Filled 2023-04-12: qty 100

## 2023-04-12 MED ORDER — ONDANSETRON HCL 4 MG PO TABS: 4.0000 mg | ORAL_TABLET | Freq: Three times a day (TID) | ORAL | 0 refills | Status: AC | PRN

## 2023-04-12 MED ORDER — PROPOFOL 10 MG/ML IV BOLUS
INTRAVENOUS | Status: AC
  Filled 2023-04-12: qty 20

## 2023-04-12 MED ORDER — OXYCODONE HCL 5 MG/5ML PO SOLN: 5.0000 mg | Freq: Once | ORAL | Status: AC | PRN

## 2023-04-12 MED ORDER — EPHEDRINE 5 MG/ML INJ
INTRAVENOUS | Status: AC
  Filled 2023-04-12: qty 5

## 2023-04-12 MED ORDER — LIDOCAINE HCL (CARDIAC) PF 100 MG/5ML IV SOSY
PREFILLED_SYRINGE | INTRAVENOUS | Status: DC | PRN
  Administered 2023-04-12: 80 mg via INTRAVENOUS

## 2023-04-12 MED ORDER — PHENYLEPHRINE HCL (PRESSORS) 10 MG/ML IV SOLN
INTRAVENOUS | Status: DC | PRN
  Administered 2023-04-12 (×3): 80 ug via INTRAVENOUS

## 2023-04-12 MED ORDER — OXYCODONE HCL 5 MG PO TABS
5.0000 mg | ORAL_TABLET | Freq: Once | ORAL | Status: AC | PRN
  Administered 2023-04-12: 5 mg via ORAL

## 2023-04-12 MED ORDER — HYDROCODONE-ACETAMINOPHEN 5-325 MG PO TABS: 1.0000 | ORAL_TABLET | Freq: Four times a day (QID) | ORAL | 0 refills | Status: AC | PRN

## 2023-04-12 MED ORDER — OXYCODONE HCL 5 MG PO TABS
ORAL_TABLET | ORAL | Status: AC
  Filled 2023-04-12: qty 1

## 2023-04-12 MED ORDER — BUPIVACAINE-EPINEPHRINE (PF) 0.5% -1:200000 IJ SOLN
INTRAMUSCULAR | Status: DC | PRN
  Administered 2023-04-12: 13 mL via PERINEURAL

## 2023-04-12 MED ORDER — FENTANYL CITRATE (PF) 100 MCG/2ML IJ SOLN: 25.0000 ug | INTRAMUSCULAR | Status: DC | PRN

## 2023-04-12 MED ORDER — FENTANYL CITRATE (PF) 100 MCG/2ML IJ SOLN
INTRAMUSCULAR | Status: DC | PRN
  Administered 2023-04-12 (×2): 25 ug via INTRAVENOUS

## 2023-04-12 MED ORDER — CEFAZOLIN SODIUM-DEXTROSE 2-4 GM/100ML-% IV SOLN
2.0000 g | INTRAVENOUS | Status: AC
  Administered 2023-04-12: 2 g via INTRAVENOUS

## 2023-04-12 MED ORDER — PHENYLEPHRINE 80 MCG/ML (10ML) SYRINGE FOR IV PUSH (FOR BLOOD PRESSURE SUPPORT)
PREFILLED_SYRINGE | INTRAVENOUS | Status: AC
Start: 2023-04-12 — End: ?
  Filled 2023-04-12: qty 10

## 2023-04-12 MED ORDER — DEXAMETHASONE SODIUM PHOSPHATE 10 MG/ML IJ SOLN
INTRAMUSCULAR | Status: AC
  Filled 2023-04-12: qty 1

## 2023-04-12 MED ORDER — SODIUM CHLORIDE 0.9 % IR SOLN
Status: DC | PRN
  Administered 2023-04-12: 3000 mL

## 2023-04-12 SURGICAL SUPPLY — 23 items
BLADE SHAVER TORPEDO 4X13 (MISCELLANEOUS) ×1 IMPLANT
BNDG ELASTIC 6INX 5YD STR LF (GAUZE/BANDAGES/DRESSINGS) ×1 IMPLANT
CLSR STERI-STRIP ANTIMIC 1/2X4 (GAUZE/BANDAGES/DRESSINGS) ×1 IMPLANT
DRAPE U-SHAPE 47X51 STRL (DRAPES) ×1 IMPLANT
DRAPE-T ARTHROSCOPY W/POUCH (DRAPES) ×1 IMPLANT
DURAPREP 26ML APPLICATOR (WOUND CARE) ×1 IMPLANT
GAUZE PAD ABD 8X10 STRL (GAUZE/BANDAGES/DRESSINGS) ×1 IMPLANT
GAUZE SPONGE 4X4 12PLY STRL (GAUZE/BANDAGES/DRESSINGS) ×1 IMPLANT
GLOVE BIO SURGEON STRL SZ7.5 (GLOVE) ×2 IMPLANT
GLOVE BIOGEL PI IND STRL 8 (GLOVE) ×2 IMPLANT
GOWN STRL REUS W/ TWL LRG LVL3 (GOWN DISPOSABLE) ×1 IMPLANT
GOWN STRL REUS W/ TWL XL LVL3 (GOWN DISPOSABLE) IMPLANT
GOWN STRL REUS W/TWL XL LVL3 (GOWN DISPOSABLE) ×2 IMPLANT
MANIFOLD NEPTUNE II (INSTRUMENTS) ×1 IMPLANT
PACK ARTHROSCOPY DSU (CUSTOM PROCEDURE TRAY) ×1 IMPLANT
PACK BASIN DAY SURGERY FS (CUSTOM PROCEDURE TRAY) ×1 IMPLANT
SUT ETHILON 4 0 PS 2 18 (SUTURE) IMPLANT
SUT MNCRL AB 3-0 PS2 18 (SUTURE) ×1 IMPLANT
TOWEL GREEN STERILE FF (TOWEL DISPOSABLE) ×1 IMPLANT
TUBE CONNECTING 20X1/4 (TUBING) IMPLANT
TUBING ARTHROSCOPY IRRIG 16FT (MISCELLANEOUS) ×1 IMPLANT
WAND ABLATOR APOLLO I90 (BUR) IMPLANT
WRAP KNEE MAXI GEL POST OP (GAUZE/BANDAGES/DRESSINGS) IMPLANT

## 2023-04-12 NOTE — Anesthesia Preprocedure Evaluation (Addendum)
 Anesthesia Evaluation  Patient identified by MRN, date of birth, ID band Patient awake    Reviewed: Allergy & Precautions, NPO status , Patient's Chart, lab work & pertinent test results  Airway Mallampati: I  TM Distance: >3 FB Neck ROM: Full    Dental no notable dental hx. (+) Teeth Intact, Dental Advisory Given   Pulmonary sleep apnea and Continuous Positive Airway Pressure Ventilation    Pulmonary exam normal breath sounds clear to auscultation       Cardiovascular hypertension, Pt. on medications + angina  Normal cardiovascular exam Rhythm:Regular Rate:Normal  TTE 2024  1. Left ventricular ejection fraction, by estimation, is 60 to 65%. The  left ventricle has normal function. The left ventricle has no regional  wall motion abnormalities. Left ventricular diastolic parameters were  normal. The average left ventricular  global longitudinal strain is -19.0 %.   2. Right ventricular systolic function is normal. The right ventricular  size is normal. Tricuspid regurgitation signal is inadequate for assessing  PA pressure.   3. The mitral valve is normal in structure. Trivial mitral valve  regurgitation. No evidence of mitral stenosis.   4. The aortic valve is tricuspid. Aortic valve regurgitation is not  visualized. No aortic stenosis is present.     Neuro/Psych negative neurological ROS  negative psych ROS   GI/Hepatic Neg liver ROS,GERD  Medicated,,  Endo/Other  negative endocrine ROS    Renal/GU negative Renal ROS  negative genitourinary   Musculoskeletal negative musculoskeletal ROS (+)    Abdominal   Peds  Hematology negative hematology ROS (+)   Anesthesia Other Findings   Reproductive/Obstetrics                             Anesthesia Physical Anesthesia Plan  ASA: 3  Anesthesia Plan: General   Post-op Pain Management: Tylenol PO (pre-op)*   Induction:  Intravenous  PONV Risk Score and Plan: 2 and Ondansetron, Dexamethasone and Treatment may vary due to age or medical condition  Airway Management Planned: LMA  Additional Equipment:   Intra-op Plan:   Post-operative Plan: Extubation in OR  Informed Consent: I have reviewed the patients History and Physical, chart, labs and discussed the procedure including the risks, benefits and alternatives for the proposed anesthesia with the patient or authorized representative who has indicated his/her understanding and acceptance.     Dental advisory given  Plan Discussed with: CRNA  Anesthesia Plan Comments:        Anesthesia Quick Evaluation

## 2023-04-12 NOTE — Brief Op Note (Signed)
 04/12/2023  8:13 AM  PATIENT:  Randy Ramos  78 y.o. male  PRE-OPERATIVE DIAGNOSIS:  Left knee medial meniscus tear  POST-OPERATIVE DIAGNOSIS: 1.  Left knee medial meniscus tear 2.  Left knee lateral meniscus tear 3.  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  Left KNEE ARTHROSCOPY WITH PARTIAL MEDIAL AND LATERAL MENISECTOMies(Left) 2.  Left knee arthroscopic chondroplasty SURGEON:  Surgeons and Role:    * Aundria Rud, Noah Delaine, MD - Primary  PHYSICIAN ASSISTANT: Kevan McCllung, PA-C  ANESTHESIA:   local  EBL:  5 mL   BLOOD ADMINISTERED:none  DRAINS: none   LOCAL MEDICATIONS USED:  MARCAINE     SPECIMEN:  No Specimen  DISPOSITION OF SPECIMEN:  N/A  COUNTS:  YES  TOURNIQUET:  * No tourniquets in log *  DICTATION: .Note written in EPIC  PLAN OF CARE: Discharge to home after PACU  PATIENT DISPOSITION:  PACU - hemodynamically stable.   Delay start of Pharmacological VTE agent (>24hrs) due to surgical blood loss or risk of bleeding: not applicable

## 2023-04-12 NOTE — Op Note (Signed)
 Surgery Date: 04/12/2023  Surgeon(s): Yolonda Kida, MD  Assistant:  Dion Saucier, PA-C  Assistant Attestation: [facility], present for the entire procedure.    ANESTHESIA:  general, 13 cc quarter percent plain Marcaine  FLUIDS: Per anesthesia record.   ESTIMATED BLOOD LOSS: minimal  PREOPERATIVE DIAGNOSES:  1. Left knee medial meniscus tear 2.  Left knee synovitis  POSTOPERATIVE DIAGNOSES:  1. Left knee medial meniscus tear 2.  Left knee synovitis 3.  Left knee lateral meniscus tear 4.  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  PROCEDURES PERFORMED:  1.  Left knee arthroscopy with limited synovectomy 2.  Left knee arthroscopy with arthroscopic partial medial and lateral meniscectomies 3.  Left knee arthroscopy with arthroscopic chondroplasty medial femoral condyle and medial tibial plateau.   DESCRIPTION OF PROCEDURE: Randy Ramos is a 78 y.o.-year-old male with left knee medial meniscus tear. Plans are to proceed with partial medial meniscectomy and diagnostic arthroscopy with debridement as indicated. Full discussion held regarding risks benefits alternatives and complications related surgical intervention. Conservative care options reviewed. All questions answered.  The patient was identified in the preoperative holding area and the operative extremity was marked. The patient was brought to the operating room and transferred to operating table in a supine position. Satisfactory general anesthesia was induced by anesthesiology.    Standard anterolateral, anteromedial arthroscopy portals were obtained. The anteromedial portal was obtained with a spinal needle for localization under direct visualization with subsequent diagnostic findings.   Anteromedial and anterolateral chambers: moderate synovitis. The synovitis was debrided with a 4.5 mm full radius shaver through both the anteromedial and  lateral portals.   Suprapatellar pouch and gutters: moderate synovitis or debris. Patella chondral surface: Grade 2 Trochlear chondral surface: Grade 2 Patellofemoral tracking: midline and level Medial meniscus: Complex tearing with radial tear at the junction of the mid body and posterior horn.  Then from there along the posterior horn horizontal tear with an unstable femoral side right at the root with a fragment flipped vertically posterior medial femoral condyle.  Medial femoral condyle flexion bearing surface: Grade 3 Medial femoral condyle extension bearing surface: Grade 2 Medial tibial plateau: Grade 1 Anterior cruciate ligament:stable Posterior cruciate ligament:stable Lateral meniscus: Free edge tearing mid body which was horizontal as well as some degenerative tearing of the anterior horn free edge in the white zone as well.   Lateral femoral condyle flexion bearing surface: Grade 0 Lateral femoral condyle extension bearing surface: Grade 0 Lateral tibial plateau: Grade 0  Following diagnostic arthroscopy we performed a limited partial lateral meniscectomy of motorized shaver to remove the free edge tearing of the anterior horn mid body.  We next turned attention back to the medial compartment where we performed chondroplasty on the medial femoral condyle as well as medial tibial plateau with motorized shaver.  Next, with combination of meniscal biter as well as motorized shaver we resected the unstable meniscal fragments from radial tear at the mid body posterior horn junction as well as the inferior leaflet of the horizontal tear moving more posteriorly.  Lastly, there was a unstable fragment flipped vertically into the intercondylar notch at the root.  We delivered this into the joint with a probe.  Randy Ramos  After completion of synovectomy, diagnostic exam, and debridements as described, all compartments were checked and no residual debris remained. Hemostasis was achieved  with the cautery wand. The portals were approximated with buried  monocryl. All excess fluid was expressed from the joint.  Xeroform sterile gauze dressings were applied followed by Ace bandage and ice pack.   DISPOSITION: The patient was awakened from general anesthetic, extubated, taken to the recovery room in medically stable condition, no apparent complications. The patient may be weightbearing as tolerated to the operative lower extremity.  Range of motion of left knee as tolerated.

## 2023-04-12 NOTE — Anesthesia Procedure Notes (Signed)
 Procedure Name: LMA Insertion Date/Time: 04/12/2023 7:43 AM  Performed by: Lauralyn Primes, CRNAPre-anesthesia Checklist: Patient identified, Emergency Drugs available, Suction available and Patient being monitored Patient Re-evaluated:Patient Re-evaluated prior to induction Oxygen Delivery Method: Circle system utilized Preoxygenation: Pre-oxygenation with 100% oxygen Induction Type: IV induction Ventilation: Mask ventilation without difficulty LMA: LMA inserted LMA Size: 5.0 Number of attempts: 1 Airway Equipment and Method: Bite block Placement Confirmation: positive ETCO2 Tube secured with: Tape Dental Injury: Teeth and Oropharynx as per pre-operative assessment

## 2023-04-12 NOTE — Transfer of Care (Signed)
 Immediate Anesthesia Transfer of Care Note  Patient: Randy Ramos  Procedure(s) Performed: KNEE ARTHROSCOPY WITH PARTIAL MEDIAL MENISECTOMY (Left: Knee)  Patient Location: PACU  Anesthesia Type:General  Level of Consciousness: drowsy  Airway & Oxygen Therapy: Patient Spontanous Breathing and Patient connected to face mask oxygen  Post-op Assessment: Report given to RN and Post -op Vital signs reviewed and stable  Post vital signs: Reviewed and stable  Last Vitals:  Vitals Value Taken Time  BP 105/53 04/12/23 0815  Temp    Pulse 64 04/12/23 0817  Resp 24 04/12/23 0817  SpO2 96 % 04/12/23 0817  Vitals shown include unfiled device data.  Last Pain:  Vitals:   04/12/23 0646  TempSrc: Temporal  PainSc: 0-No pain      Patients Stated Pain Goal: 3 (04/12/23 0981)  Complications: No notable events documented.

## 2023-04-12 NOTE — Discharge Instructions (Addendum)
 May take hydrocodone (pain pill) after 2pm if needed.  Post-operative patient instructions  Knee Arthroscopy   Ice:  Place intermittent ice or cooler pack over your knee, 30 minutes on and 30 minutes off.  Continue this for the first 72 hours after surgery, then save ice for use after therapy sessions or on more active days.   Weight:  You may bear weight on your leg as your symptoms allow. DVT prevention: Perform ankle pumps as able throughout the day on the operative extremity.  Be mobile as possible with ambulation as able.  You should also take an 81 mg aspirin once per day x6 weeks. Crutches:  Use crutches (or walker) to assist in walking until told to discontinue by your physical therapist or physician. This will help to reduce pain. Strengthening:  Perform simple thigh squeezes (isometric quad contractions) and straight leg lifts as you are able (3 sets of 5 to 10 repetitions, 3 times a day).  For the leg lifts, have someone support under your ankle in the beginning until you have increased strength enough to do this on your own.  To help get started on thigh squeezes, place a pillow under your knee and push down on the pillow with back of knee (sometimes easier to do than with your leg fully straight). Motion:  Perform gentle knee motion as tolerated - this is gentle bending and straightening of the knee. Seated heel slides: you can start by sitting in a chair, remove your brace, and gently slide your heel back on the floor - allowing your knee to bend. Have someone help you straighten your knee (or use your other leg/foot hooked under your ankle.  Dressing:  Perform 1st dressing change at 3 days postoperative. A moderate amount of blood tinged drainage is to be expected.  So if you bleed through the dressing on the first or second day or if you have fevers, it is fine to change the dressing/check the wounds early and redress wound. Elevate your leg.   Shower:  shower is ok after 3 days.  Please  take shower, NO bath. Recover with gauze and ace wrap to help keep wounds protected.   Pain medication:  A narcotic pain medication has been prescribed.  Take as directed.  Typically you need narcotic pain medication more regularly during the first 3 to 5 days after surgery.  Decrease your use of the medication as the pain improves.  Narcotics can sometimes cause constipation, even after a few doses.  If you have problems with constipation, you can take an over the counter stool softener or light laxative.  If you have persistent problems, please notify your physician's office. Physical therapy: Additional activity guidelines to be provided by your physician or physical therapist at follow-up visits.  Driving:  Should not drive while taking narcotic pain medications. It typically takes at least 2 weeks to restore sufficient neuromuscular function for normal reaction times for driving safety.  Call [phone] for questions or problems. Evenings you will be forwarded to the hospital operator.  Ask for the orthopaedic physician on call. Please call if you experience:    Redness, foul smelling, or persistent drainage from the surgical site  worsening knee pain and swelling not responsive to medication  any calf pain and or swelling of the lower leg  temperatures greater than 101.5 F other questions or concerns   Thank you for allowing [place] to be a part of your care    Post Anesthesia Home Care  Instructions  Activity: Get plenty of rest for the remainder of the day. A responsible individual must stay with you for 24 hours following the procedure.  For the next 24 hours, DO NOT: -Drive a car -Advertising copywriter -Drink alcoholic beverages -Take any medication unless instructed by your physician -Make any legal decisions or sign important papers.  Meals: Start with liquid foods such as gelatin or soup. Progress to regular foods as tolerated. Avoid greasy, spicy, heavy foods. If nausea and/or  vomiting occur, drink only clear liquids until the nausea and/or vomiting subsides. Call your physician if vomiting continues.  Special Instructions/Symptoms: Your throat may feel dry or sore from the anesthesia or the breathing tube placed in your throat during surgery. If this causes discomfort, gargle with warm salt water. The discomfort should disappear within 24 hours.  If you had a scopolamine patch placed behind your ear for the management of post- operative nausea and/or vomiting:  1. The medication in the patch is effective for 72 hours, after which it should be removed.  Wrap patch in a tissue and discard in the trash. Wash hands thoroughly with soap and water. 2. You may remove the patch earlier than 72 hours if you experience unpleasant side effects which may include dry mouth, dizziness or visual disturbances. 3. Avoid touching the patch. Wash your hands with soap and water after contact with the patch.

## 2023-04-12 NOTE — H&P (Signed)
 ORTHOPAEDIC H and P  REQUESTING PHYSICIAN: Yolonda Kida, MD  PCP:  Hadley Pen, MD  Chief Complaint: Left kne pain  HPI: Randy Ramos is a 78 y.o. male who complains of  Left knee pain and swelling.  Here today for surgery.  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  01/2010 LHC with minimal nonobstructive CAD b.qualifier: Diagnosis of  By: Manson Passey, RN, BSN, Lauren   BPH (benign prostatic hyperplasia)    Cataract    Essential (primary) hypertension    GERD (gastroesophageal reflux disease)    Mixed hyperlipidemia    Has expereinced some intolerance to statins in the past   Pericarditis    Restless leg syndrome    Sleep apnea    Past Surgical History:  Procedure Laterality Date   CARDIAC CATHETERIZATION     Social History   Socioeconomic History   Marital status: Married    Spouse name: Not on file   Number of children: Not on file   Years of education: Not on file   Highest education level: Not on file  Occupational History   Not on file  Tobacco Use   Smoking status: Never   Smokeless tobacco: Former  Advertising account planner   Vaping status: Never Used  Substance and Sexual Activity   Alcohol use: No   Drug use: Never   Sexual activity: Not on file  Other Topics Concern   Not on file  Social History Narrative   Not on file   Social Drivers of Health   Financial Resource Strain: Not on file  Food Insecurity: Low Risk  (01/10/2023)   Received from [facility]   Hunger Vital Sign    Worried About Running Out of Food in the Last Year: Never true    Ran Out of Food in the Last Year: Never true  Transportation Needs: No Transportation Needs (01/10/2023)   Received from [facility]    In the past 12 months, has lack of reliable transportation kept you from medical appointments, meetings, work or from getting things needed for daily living? : No  Physical Activity: Not on file  Stress: Not on file  Social  Connections: Not on file   Family History  Problem Relation Age of Onset   Hypertension Mother    Esophageal cancer Father    Hypertension Father    Restless legs syndrome Sister    Colon cancer Neg Hx    Rectal cancer Neg Hx    Stomach cancer Neg Hx    Colon polyps Neg Hx    No Known Allergies Prior to Admission medications   Medication Sig Start Date End Date Taking? Authorizing Provider  amLODipine (NORVASC) 10 MG tablet Take 1 tablet (10 mg total) by mouth daily. 01/14/23  Yes Tonny Bollman, MD  ezetimibe (ZETIA) 10 MG tablet Take 10 mg by mouth daily. 12/21/16  Yes [provider]  finasteride (PROSCAR) 5 MG tablet Take 5 mg by mouth daily. 10/27/16  Yes [provider]  fluticasone (FLONASE) 50 MCG/ACT nasal spray Place 1 spray into both nostrils as needed. 05/27/21  Yes [provider]  gabapentin (NEURONTIN) 300 MG capsule Take 300 mg by mouth daily.   Yes [provider]  lisinopril (ZESTRIL) 20 MG tablet Take 1 tablet (20 mg total) by mouth daily. 12/21/22  Yes Tonny Bollman, MD  pantoprazole (PROTONIX) 40 MG tablet Take 40 mg by mouth daily. 10/29/16  Yes [provider]  rOPINIRole (REQUIP XL) 2 MG 24 hr tablet Take 1 tablet (2 mg total) by mouth at bedtime. 02/18/23  Yes Lomax, Amy, NP  tamsulosin (FLOMAX) 0.4 MG CAPS capsule Take 0.8 mg by mouth at bedtime. 10/27/16  Yes [provider]  chlorhexidine (PERIDEX) 0.12 % solution PRN 07/10/19   [provider]  ciclopirox (LOPROX) 0.77 % cream Apply 0.77 % topically 2 (two) times daily. 07/06/22   [provider]  Coenzyme Q10-Vitamin E (QUNOL ULTRA COQ10 PO) Take 1 capsule by mouth daily.    [provider]  Fexofenadine HCl (ALLEGRA ALLERGY PO) Take 1 Dose by mouth as needed.    [provider]  SODIUM FLUORIDE 5000 PPM 1.1 % PSTE Take 1.1 1e11 [employer] by mouth See admin instructions. 08/24/22   [provider]  traMADol  (ULTRAM) 50 MG tablet Take 50 mg by mouth as needed. 01/15/22   [provider]   No results found.  Positive ROS: All other systems have been reviewed and were otherwise negative with the exception of those mentioned in the HPI and as above.  Physical Exam: General: Alert, no acute distress Cardiovascular: No pedal edema Respiratory: No cyanosis, no use of accessory musculature GI: No organomegaly, abdomen is soft and non-tender Skin: No lesions in the area of chief complaint Neurologic: Sensation intact distally Psychiatric: Patient is competent for consent with normal mood and affect Lymphatic: No axillary or cervical lymphadenopathy  MUSCULOSKELETAL: LLE- wwp, nvi  Assessment: Left knee medial meniscus tear  Plan: Proceed today with left knee arthroscopy and partial medial meniscectomy.  The risks, benefits, and alternatives were discussed with the patient. There are risks associated with the surgery including, but not limited to, problems with anesthesia (death), infection, differences in leg length/angulation/rotation, fracture of bones, loosening or failure of implants, malunion, nonunion, hematoma (blood accumulation) which may require surgical drainage, blood clots, pulmonary embolism, nerve injury (foot drop), and blood vessel injury. The patient understands these risks and elects to proceed.   Dc home from PACU    Yolonda Kida, MD Cell [phone]    04/12/2023 6:36 AM

## 2023-04-12 NOTE — Anesthesia Postprocedure Evaluation (Signed)
 Anesthesia Post Note  Patient: Randy Ramos  Procedure(s) Performed: KNEE ARTHROSCOPY WITH PARTIAL MEDIAL MENISECTOMY (Left: Knee)     Patient location during evaluation: PACU Anesthesia Type: General Level of consciousness: awake and alert Pain management: pain level controlled Vital Signs Assessment: post-procedure vital signs reviewed and stable Respiratory status: spontaneous breathing, nonlabored ventilation, respiratory function stable and patient connected to nasal cannula oxygen Cardiovascular status: blood pressure returned to baseline and stable Postop Assessment: no apparent nausea or vomiting Anesthetic complications: no  No notable events documented.  Last Vitals:  Vitals:   04/12/23 0845 04/12/23 0916  BP: 129/63 (!) 138/58  Pulse: 67 69  Resp: 14 14  Temp:  (!) 36.2 C  SpO2: 95% 95%    Last Pain:  Vitals:   04/12/23 0916  TempSrc: Temporal  PainSc: 2                  Kyl Givler L Prescious Hurless

## 2023-04-13 ENCOUNTER — Encounter (HOSPITAL_BASED_OUTPATIENT_CLINIC_OR_DEPARTMENT_OTHER): Payer: Self-pay | Admitting: Orthopedic Surgery

## 2023-04-19 DIAGNOSIS — M25562 Pain in left knee: Secondary | ICD-10-CM | POA: Diagnosis not present

## 2023-04-24 DIAGNOSIS — M25562 Pain in left knee: Secondary | ICD-10-CM | POA: Diagnosis not present

## 2023-04-29 DIAGNOSIS — M25562 Pain in left knee: Secondary | ICD-10-CM | POA: Diagnosis not present

## 2023-05-01 DIAGNOSIS — G4733 Obstructive sleep apnea (adult) (pediatric): Secondary | ICD-10-CM | POA: Diagnosis not present

## 2023-05-02 DIAGNOSIS — M25562 Pain in left knee: Secondary | ICD-10-CM | POA: Diagnosis not present

## 2023-05-06 DIAGNOSIS — M25562 Pain in left knee: Secondary | ICD-10-CM | POA: Diagnosis not present

## 2023-05-06 DIAGNOSIS — G4733 Obstructive sleep apnea (adult) (pediatric): Secondary | ICD-10-CM | POA: Diagnosis not present

## 2023-05-09 DIAGNOSIS — R0981 Nasal congestion: Secondary | ICD-10-CM | POA: Diagnosis not present

## 2023-05-18 IMAGING — CT CT HEART MORP W/ CTA COR W/ SCORE W/ CA W/CM &/OR W/O CM
4 of 7 series · 8 of 20 positions shown, 9 images · IV contrast (APPLIED)
Comparison: None.
COMPARISON: None.

Addendum:
EXAM:
OVER-READ INTERPRETATION  CT CHEST

The following report is an over-read performed by radiologist Dr.
Ruble Gavilan [REDACTED] on 05/25/2021. This
over-read does not include interpretation of cardiac or coronary
anatomy or pathology. The coronary calcium score/coronary CTA
interpretation by the cardiologist is attached.
CLINICAL DATA: Chest pain
Cardiac/Coronary CTA
TECHNIQUE: A non-contrast, gated CT scan was obtained with axial slices of 3 mm
through the heart for calcium scoring. Calcium scoring was performed
using the Agatston method. A 120 kV prospective, gated, contrast
cardiac scan was obtained. Gantry rotation speed was 250 msecs and
collimation was 0.6 mm. Two sublingual nitroglycerin tablets (0.8
mg) were given. The 3D data set was reconstructed in 5% intervals of
the 35-75% of the R-R cycle. Diastolic phases were analyzed on a
dedicated workstation using MPR, MIP, and VRT modes. The patient
received 95 cc of contrast.

[Series 6: best diast · axial · 0.39mm/px · z∈[+1270,+1306]mm · 2 of 268 slices shown, 3 images]
[im 90/268  vessel]
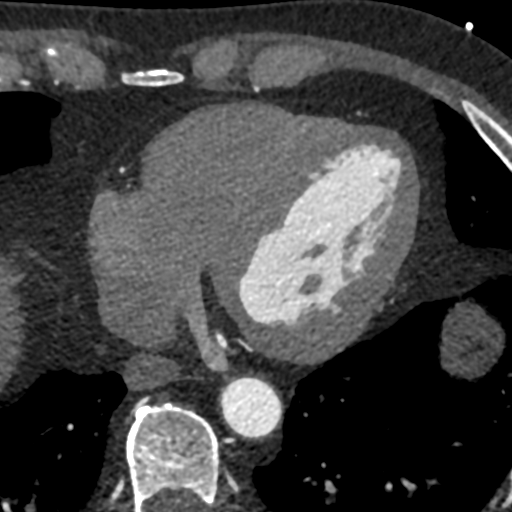
[im 90/268  lung]
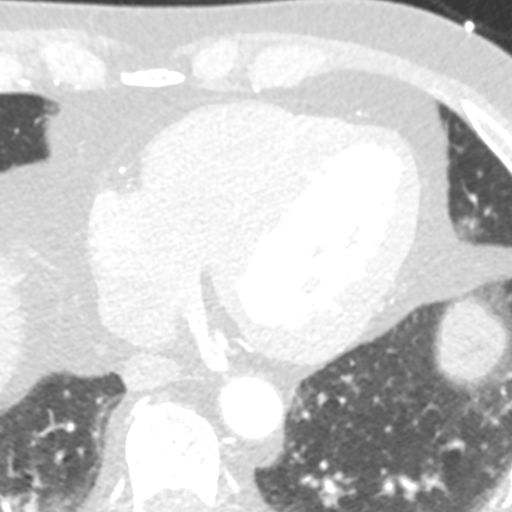
[im 179/268  vessel]
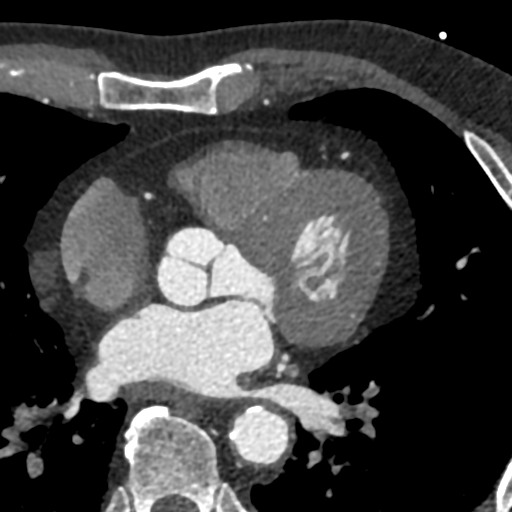

[Series 7: best syst 38 % · axial · 0.39mm/px · z∈[+1270,+1306]mm · 2 of 268 slices shown]
[im 90/268  vessel]
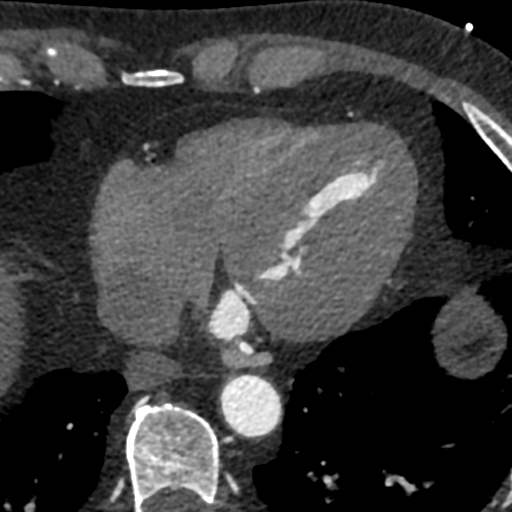
[im 179/268  vessel]
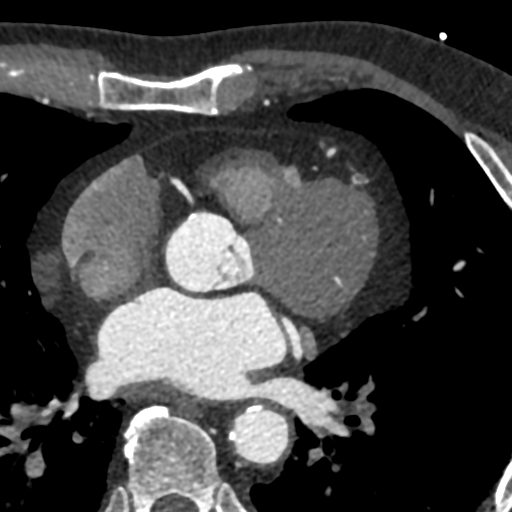

[Series 8: ts diast sharp · axial · 0.39mm/px · z∈[+1270,+1306]mm · 2 of 268 slices shown]
[im 90/268  lung]
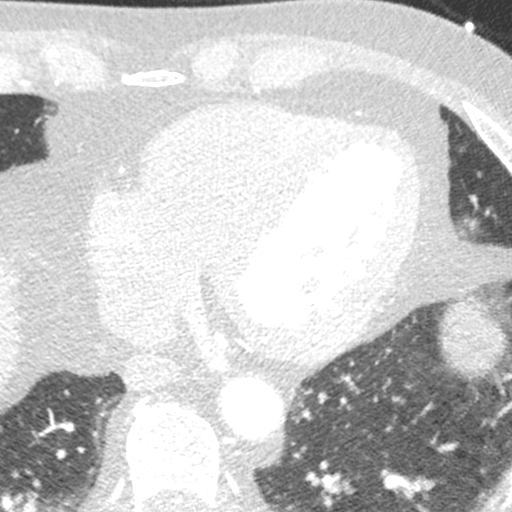
[im 179/268  lung]
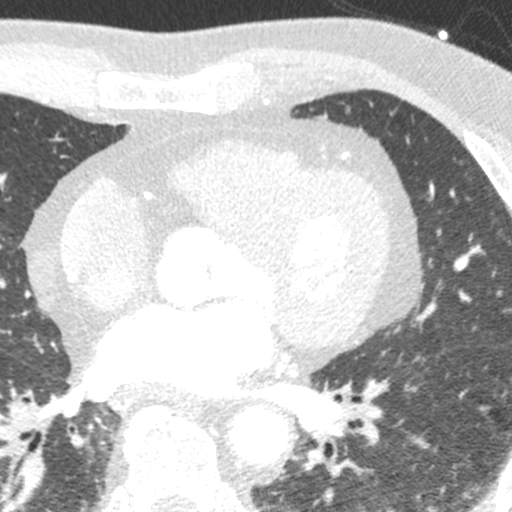

[Series 9: ts syst sharp 38 % · axial · 0.39mm/px · z∈[+1270,+1306]mm · 2 of 268 slices shown]
[im 90/268  lung]
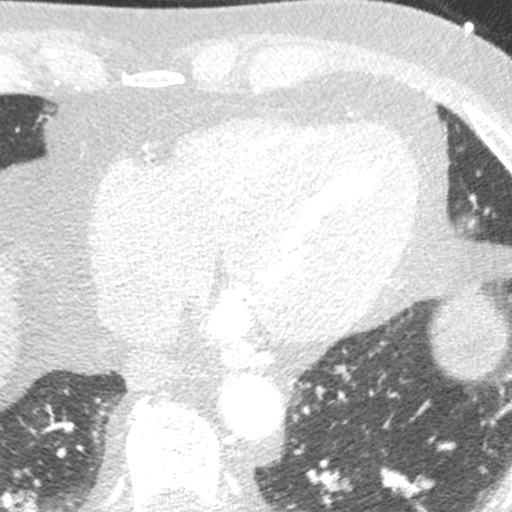
[im 179/268  lung]
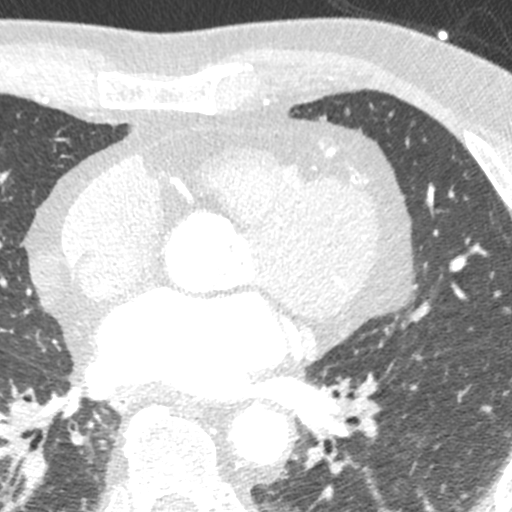

[8 of 20 positions shown; findings below may reference images not displayed]

FINDINGS: Atherosclerotic calcifications in the thoracic aorta. Within the
visualized portions of the thorax there are no suspicious appearing
pulmonary nodules or masses, there is no acute consolidative
airspace disease, no pleural effusions, no pneumothorax and no
lymphadenopathy. Small hiatal hernia. Visualized portions of the
upper abdomen are unremarkable. There are no aggressive appearing
lytic or blastic lesions noted in the visualized portions of the
skeleton.
IMPRESSION: 1.  Aortic Atherosclerosis (A6AIS-K3J.J).
2. Small hiatal hernia.
FINDINGS: Image quality: Excellent.

Noise artifact is: Limited.

Coronary Arteries:  Normal coronary origin.  Left dominance.

Left main: The left main is a large caliber vessel with a normal
take off from the left coronary cusp that bifurcates to form a left
anterior descending artery and a left circumflex artery. There is no
plaque or stenosis.

Left anterior descending artery: The proximal LAD contains minimal
calcified plaque (<25%). The mid LAD contains mild calcified plaque
(25-49%). The distal LAD is patent. The LAD gives off 2 patent
diagonal branches.

Left circumflex artery: The LCX is dominant and patent with no
evidence of plaque or stenosis. The LCX gives off 3 patent obtuse
marginal branches. The LCX terminates as a patent PDA.

Right coronary artery: The RCA is non-dominant with normal take off
from the right coronary cusp. There is no evidence of plaque or
stenosis.

Right Atrium: Right atrial size is within normal limits.

Right Ventricle: The right ventricular cavity is within normal
limits.

Left Atrium: Left atrial size is normal in size with no left atrial
appendage filling defect.

Left Ventricle: The ventricular cavity size is within normal limits.

Pulmonary arteries: Normal in size without proximal filling defect.

Pulmonary veins: Normal pulmonary venous drainage.

Pericardium: Normal thickness without significant effusion or
calcium present.

Cardiac valves: The aortic valve is trileaflet without significant
calcification. The mitral valve is normal without significant
calcification.

Aorta: Normal caliber without significant disease.

Extra-cardiac findings: See attached radiology report for
non-cardiac structures.
IMPRESSION: 1. Coronary calcium score of 71.4. This was 28th percentile for
age-, sex, and race-matched controls.

2. Normal coronary origin with left dominance.

3. Mild calcified plaque (25-49%) in the mid LAD.

RECOMMENDATIONS:
1. Mild non-obstructive CAD (25-49%). Consider non-atherosclerotic
causes of chest pain. Consider preventive therapy and risk factor
modification.

*** End of Addendum ***
EXAM:
OVER-READ INTERPRETATION  CT CHEST

The following report is an over-read performed by radiologist Dr.
Ruble Gavilan [REDACTED] on 05/25/2021. This
over-read does not include interpretation of cardiac or coronary
anatomy or pathology. The coronary calcium score/coronary CTA
interpretation by the cardiologist is attached.
FINDINGS: Atherosclerotic calcifications in the thoracic aorta. Within the
visualized portions of the thorax there are no suspicious appearing
pulmonary nodules or masses, there is no acute consolidative
airspace disease, no pleural effusions, no pneumothorax and no
lymphadenopathy. Small hiatal hernia. Visualized portions of the
upper abdomen are unremarkable. There are no aggressive appearing
lytic or blastic lesions noted in the visualized portions of the
skeleton.
IMPRESSION: 1.  Aortic Atherosclerosis (A6AIS-K3J.J).
2. Small hiatal hernia.

## 2023-05-22 ENCOUNTER — Telehealth: Payer: Self-pay | Admitting: Neurology

## 2023-05-22 NOTE — Telephone Encounter (Signed)
 Wife has called requesting an appointment for an evaluation of CPAP for new insurance pt has now.

## 2023-06-04 ENCOUNTER — Encounter: Payer: Medicare Other | Admitting: Family Medicine

## 2023-06-05 DIAGNOSIS — G4733 Obstructive sleep apnea (adult) (pediatric): Secondary | ICD-10-CM | POA: Diagnosis not present

## 2023-06-06 ENCOUNTER — Telehealth: Payer: Self-pay | Admitting: Neurology

## 2023-06-06 NOTE — Telephone Encounter (Signed)
 Appointment details confirmed

## 2023-06-24 ENCOUNTER — Telehealth: Payer: Self-pay | Admitting: Family Medicine

## 2023-06-24 NOTE — Telephone Encounter (Signed)
 LVM and sent mychart msg informing pt of need to reschedule 07/15/23 appt - NP out

## 2023-06-30 ENCOUNTER — Encounter: Payer: Self-pay | Admitting: Cardiovascular Disease

## 2023-07-02 ENCOUNTER — Telehealth: Payer: Self-pay | Admitting: Family Medicine

## 2023-07-02 NOTE — Telephone Encounter (Signed)
 Pt wife called in regards to wanting an earlier appt the patient insurance is about to expire and Pt need Cpap machine checked . Wife was trying to get an appt in june . Wife also stated that they missed the appt on 5-19 due to being out of town .

## 2023-07-02 NOTE — Telephone Encounter (Signed)
 Pt last seen 03/25/23 by Dr. Omar Bibber. Next appt currently scheduled with Amy on 10/14/23.   Dr. Omar Bibber has sooner appt 07/24/23 at 7:45am. I called and spoke w/ wife. She accepted this appt. I scheduled. Cx 10/2023 appt since he is coming in sooner.

## 2023-07-06 DIAGNOSIS — G4733 Obstructive sleep apnea (adult) (pediatric): Secondary | ICD-10-CM | POA: Diagnosis not present

## 2023-07-14 NOTE — Progress Notes (Signed)
 Cardiology Office Note    Patient Name: Randy Ramos Date of Encounter: 07/14/2023  Primary Care Provider:  Melva Stabile, MD Primary Cardiologist:  Randy Lapping, MD Primary Electrophysiologist: None   Past Medical History    Past Medical History:  Diagnosis Date   ANGINA, STABLE 01/11/2010   A.  01/2010 LHC with minimal nonobstructive CAD b.qualifier: Diagnosis of  By: Randy Bucks, RN, BSN, Randy Ramos   BPH (benign prostatic hyperplasia)    Cataract    Essential (primary) hypertension    GERD (gastroesophageal reflux disease)    Mixed hyperlipidemia    Has expereinced some intolerance to statins in the past   Pericarditis    Restless leg syndrome    Sleep apnea     History of Present Illness  Randy Ramos is a 78 y.o. male with a PMH of nonobstructive CAD, HTN, HLD, GERD, pericarditis who presents today for evaluation of hypertension.  Randy Ramos was last seen by Randy Ramos on 12/21/2022 and was noted to have elevated BP.  During visit his blood pressures have been running in the 150s over 80s.  He noted elevated blood pressures in the mornings and blood pressure during visit was 160/82.  He had lisinopril  increased to 20 mg daily and amlodipine  5 mg added.  He also reported some shortness of breath and underwent a 2D echo for further evaluation.  2D echo results showed normal EF of 60-65% with no RWMA and no significant valve abnormalities.  He reported suboptimal control with BP and had amlodipine  increased to 10 mg on 01/14/2023.  Randy Ramos contacted our office on 06/30/2022 for request of follow-up due to cramps all over the body possible orthostasis.   Patient denies chest pain, palpitations, dyspnea, PND, orthopnea, nausea, vomiting, dizziness, syncope, edema, weight gain, or early satiety.   Discussed the use of AI scribe software for clinical note transcription with the patient, who gave verbal consent to proceed.  History of Present Illness    ***Notes:   Review of  Systems  Please see the history of present illness.    All other systems reviewed and are otherwise negative except as noted above.  Physical Exam     Wt Readings from Last 3 Encounters:  04/12/23 208 lb 5.4 oz (94.5 kg)  04/02/23 213 lb 9.6 oz (96.9 kg)  03/25/23 213 lb (96.6 kg)   ZO:[id number] were no vitals filed for this visit.,There is no height or weight on file to calculate BMI. GEN: Well nourished, well developed in no acute distress Neck: No JVD; No carotid bruits Pulmonary: Clear to auscultation without rales, wheezing or rhonchi  Cardiovascular: Normal rate. Regular rhythm. Normal S1. Normal S2.   Murmurs: There is no murmur.  ABDOMEN: Soft, non-tender, non-distended EXTREMITIES:  No edema; No deformity   EKG/LABS/ Recent Cardiac Studies   ECG personally reviewed by me today - ***  Risk Assessment/Calculations:   {Does this patient have ATRIAL FIBRILLATION?:[phone]}      Lab Results  Component Value Date   WBC 7.5 04/02/2023   HGB 14.1 04/02/2023   HCT 40.9 04/02/2023   MCV 91.5 04/02/2023   PLT 293 04/02/2023   Lab Results  Component Value Date   CREATININE 0.79 04/02/2023   BUN 14 04/02/2023   NA 136 04/02/2023   K 4.1 04/02/2023   CL 102 04/02/2023   CO2 28 04/02/2023   No results found for: "CHOL", "HDL", "LDLCALC", "LDLDIRECT", "TRIG", "CHOLHDL"  Lab Results  Component Value Date  HGBA1C 6.3 (H) 05/01/2022   Assessment & Plan    Assessment and Plan Assessment & Plan     1.Essential hypertension: -Patient's blood pressure today was  2.Nonobstructive CAD: -s/p LHC 01/2010 with findings of minimal nonobstructive CAD and has ischemic evaluation by coronary CTA  3.  Hyperlipidemia  4.  Dizziness      Disposition: Follow-up with Randy Lapping, MD or APP in *** months {Are you ordering a CV Procedure (e.g. stress test, cath, DCCV, TEE, etc)?   Press F2        :[id number]}   Signed, Randy Ramos, Randy Cast, NP 07/14/2023, 11:53 AM [facility]

## 2023-07-15 ENCOUNTER — Ambulatory Visit: Admitting: Family Medicine

## 2023-07-15 ENCOUNTER — Ambulatory Visit: Attending: Nurse Practitioner | Admitting: Nurse Practitioner

## 2023-07-15 ENCOUNTER — Encounter: Payer: Self-pay | Admitting: Nurse Practitioner

## 2023-07-15 VITALS — BP 112/54 | HR 63 | Ht 69.0 in | Wt 202.0 lb

## 2023-07-15 DIAGNOSIS — I2510 Atherosclerotic heart disease of native coronary artery without angina pectoris: Secondary | ICD-10-CM

## 2023-07-15 DIAGNOSIS — I10 Essential (primary) hypertension: Secondary | ICD-10-CM

## 2023-07-15 DIAGNOSIS — R42 Dizziness and giddiness: Secondary | ICD-10-CM | POA: Diagnosis not present

## 2023-07-15 DIAGNOSIS — E782 Mixed hyperlipidemia: Secondary | ICD-10-CM

## 2023-07-15 DIAGNOSIS — R7303 Prediabetes: Secondary | ICD-10-CM

## 2023-07-15 DIAGNOSIS — R0602 Shortness of breath: Secondary | ICD-10-CM

## 2023-07-15 MED ORDER — AMLODIPINE BESYLATE 10 MG PO TABS: 10.0000 mg | ORAL_TABLET | Freq: Every day | ORAL | 3 refills | Status: DC

## 2023-07-15 MED ORDER — EZETIMIBE 10 MG PO TABS: 10.0000 mg | ORAL_TABLET | Freq: Every day | ORAL | 3 refills | Status: AC

## 2023-07-15 MED ORDER — LISINOPRIL 20 MG PO TABS
20.0000 mg | ORAL_TABLET | Freq: Every day | ORAL | 3 refills | Status: AC
Start: 2023-07-15 — End: ?

## 2023-07-15 NOTE — Patient Instructions (Signed)
 Medication Instructions:  TRY cutting Norvasc  (amlodipine ) to 5mg  twice a day (cutting your 10mg  in half and taking twice a day) *If you need a refill on your cardiac medications before your next appointment, please call your pharmacy*  Lab Work: TODAY-A1C, BMET & CBC  If you have labs (blood work) drawn today and your tests are completely normal, you will receive your results only by: MyChart Message (if you have MyChart) OR A paper copy in the mail If you have any lab test that is abnormal or we need to change your treatment, we will call you to review the results.  Testing/Procedures: NONE ORDERED  Follow-Up: At [facility], you and your health needs are our priority.  As part of our continuing mission to provide you with exceptional heart care, our providers are all part of one team.  This team includes your primary Cardiologist (physician) and Advanced Practice Providers or APPs (Physician Assistants and Nurse Practitioners) who all work together to provide you with the care you need, when you need it.  Your next appointment:   6 month(s)  Provider:   Arnoldo Lapping, MD  or Charles Connor, NP  We recommend signing up for the patient portal called "MyChart".  Sign up information is provided on this After Visit Summary.  MyChart is used to connect with patients for Virtual Visits (Telemedicine).  Patients are able to view lab/test results, encounter notes, upcoming appointments, etc.  Non-urgent messages can be sent to your provider as well.   To learn more about what you can do with MyChart, go to [email or web address].   Other Instructions

## 2023-07-16 ENCOUNTER — Ambulatory Visit: Payer: Self-pay | Admitting: Nurse Practitioner

## 2023-07-16 LAB — BASIC METABOLIC PANEL WITH GFR
BUN/Creatinine Ratio: 13 (ref 10–24)
BUN: 10 mg/dL (ref 8–27)
CO2: 22 mmol/L (ref 20–29)
Calcium: 9.5 mg/dL (ref 8.6–10.2)
Chloride: 94 mmol/L — ABNORMAL LOW (ref 96–106)
Creatinine, Ser: 0.75 mg/dL — ABNORMAL LOW (ref 0.76–1.27)
Glucose: 91 mg/dL (ref 70–99)
Potassium: 5 mmol/L (ref 3.5–5.2)
Sodium: 134 mmol/L (ref 134–144)
eGFR: 93 mL/min/{1.73_m2} (ref >59)

## 2023-07-16 LAB — HEMOGLOBIN A1C
Est. average glucose Bld gHb Est-mCnc: 105 mg/dL
Hgb A1c MFr Bld: 5.3 % (ref 4.8–5.6)

## 2023-07-16 LAB — CBC
Hematocrit: 43.5 % (ref 37.5–51.0)
Hemoglobin: 14.2 g/dL (ref 13.0–17.7)
MCH: 31.7 pg (ref 26.6–33.0)
MCHC: 32.6 g/dL (ref 31.5–35.7)
MCV: 97 fL (ref 79–97)
Platelets: 327 10*3/uL (ref 150–450)
RBC: 4.48 x10E6/uL (ref 4.14–5.80)
RDW: 12 % (ref 11.6–15.4)
WBC: 8.2 10*3/uL (ref 3.4–10.8)

## 2023-07-16 MED ORDER — AMLODIPINE BESYLATE 5 MG PO TABS: 5.0000 mg | ORAL_TABLET | Freq: Two times a day (BID) | ORAL | 4 refills | Status: AC

## 2023-07-23 DIAGNOSIS — G4733 Obstructive sleep apnea (adult) (pediatric): Secondary | ICD-10-CM | POA: Diagnosis not present

## 2023-07-24 ENCOUNTER — Encounter: Payer: Self-pay | Admitting: Neurology

## 2023-07-24 ENCOUNTER — Ambulatory Visit: Admitting: Neurology

## 2023-07-24 VITALS — BP 136/68 | Ht 69.0 in | Wt 200.0 lb

## 2023-07-24 DIAGNOSIS — G2581 Restless legs syndrome: Secondary | ICD-10-CM | POA: Diagnosis not present

## 2023-07-24 DIAGNOSIS — G4730 Sleep apnea, unspecified: Secondary | ICD-10-CM | POA: Insufficient documentation

## 2023-07-24 DIAGNOSIS — G4733 Obstructive sleep apnea (adult) (pediatric): Secondary | ICD-10-CM | POA: Diagnosis not present

## 2023-07-24 MED ORDER — ROPINIROLE HCL ER 2 MG PO TB24: 2.0000 mg | ORAL_TABLET | Freq: Every day | ORAL | 3 refills | Status: AC

## 2023-07-24 MED ORDER — GABAPENTIN 100 MG PO CAPS: 100.0000 mg | ORAL_CAPSULE | Freq: Four times a day (QID) | ORAL | 3 refills | Status: AC

## 2023-07-24 NOTE — Progress Notes (Addendum)
 Patient: Randy Ramos Date of Birth: April 09, 1945  Reason for Visit: Follow up History from: Patient Primary Neurologist: Randy Ramos  ASSESSMENT AND PLAN 79 y.o. year old male   1.  Severe OSA on CPAP - Doing great on CPAP, has superb compliance!!  AHI is well maintained with set pressure of 13 cm water.  Current AHI is 3.6.  He is motivated to continue CPAP.  Will continue to use nightly minimum 4 hours.   - Split-night sleep study November 2024 baseline AHI 68.8/H O2 nadir 66%  -CPAP setup 01/31/23  2.  Restless leg syndrome - Continues to be a chronic problem. - Continue Requip  XL 2 mg around 4 PM, trying to eliminate 2 mg Requip  IR at 8 PM - Try gabapentin at 100 mg up to 4 times a day (currently has 300 mg tablet once daily, would like to try lower strength and find suitable regimen, the single 300 mg makes him sleepy. We talked about trying 100 mg at 6 PM, 100 mg 8 PM, 100-200 mg at bedtime PRN) - Continue exercise, activity - Recheck ferritin level today - We talked about Nidra device for RLS (no implantable devices or seizures) - We will plan to follow up in 1 year with myself or Amy. Advised to call if any issues arise.   HISTORY OF PRESENT ILLNESS: Today 07/24/23 Last saw Dr. Omar Ramos February 2025 on CPAP set pressure 12 EPR 3, it was increased to set pressure of 13 due to AHI 27 with 87% compliance.  Discussed RLS, felt some symptoms of augmentation taking 4 mg total dose advised to limit his ropinirole .  Advised to take only long-acting ropinirole  2 mg at 4 PM and restart gabapentin 300 mg an hour before bedtime, reducing IR formation.  Receiving iron infusions through hematology.   I am seeing today in the office for Dr. Omar Ramos who was unexpectedly out of the office.  CPAP download of the last 30 days shows greater than 4-hour usage 93% at set pressure of 13 cm water.  AHI 3.5, leak 12.5. He is using a mask under his nose and over his mouth. He has RLS, which is his biggest problem  in life. The last 2 nights have been good. Taking Requip  XL 2 mg at 4 PM, will take 1/2 2 mg IR Requip  at 8 PM, will take gabapentin 300 mg at bedtime. He would like to try lower dose gabapentin. 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 ESS 5.   HISTORY  03/25/23 Dr. Omar Ramos: Randy Ramos is a 78 year old male with an underlying medical history of hypertension, hyperlipidemia, history of pericarditis, BPH, OSA, cervical radiculopathy, chronic neck and low back pain, who presents for follow-up consultation of his RLS and OSA.  The patient is accompanied by his wife today.  I first met him at the request of his neurosurgeon on 05/01/2022 at which time he reported a longstanding history of restless leg syndrome.  He was advised to start adjunct gabapentin in low-dose and limit his ropinirole  to up to 4 mg each day.  He was offered a sleep study but he declined at the time.  He saw  Randy Fick, NP in September 2024, at which time he was advised to resume taking gabapentin 300 mg at bedtime and limit his ropinirole  IR and XR to up to a total of 4 mg.  He was given a prescription for ropinirole   XR 2 mg strength.  A sleep study  was ordered.  He had a split-night sleep study through our sleep lab on 01/01/2023 which showed severe obstructive sleep apnea with a baseline AHI of 68.8/h, O2 nadir 66% during supine non-REM sleep.  He had absence of REM sleep during the baseline portion of the study.  He was advised to start home CPAP of 11 cm.  His set up date was 01/31/23. He has a ResMed air sense 11 AutoSet machine.  His DME provider is [facility].    Today, 03/25/2023: I reviewed his CPAP compliance data from 02/20/2023 through 03/21/2023, which is a total of 30 days, during which time he used his machine 27 days with percent use days greater than 4 hours at 87%, indicating very good compliance with an average usage of 5 hours and 26 minutes, residual AHI elevated at 27/h, leak acceptable with the 95th percentile at 7  L/min on a pressure of 12 cm with EPR of 3.  I had originally recommended a home CPAP of 13 cm but he was initially started on 11 cm and we increased it subsequently in January 2025 to 12 cm. He reports tolerating CPAP, he wonders if the pressure needs to be increased.  He still has restless leg symptoms, takes Requip  XL 2 mg at 4 PM and 2 mg IR half a pill in the evening, another half at bedtime and sometimes a third half later on.  He is no longer taking gabapentin, tried it a couple of times and admits that it may not have been long enough.  He is willing to get back on gabapentin.  He has a prescription for Requip  IR from another prescriber and has some leftover pills.  He does not always hydrate well with water, drinks about 2 to 3 cups of coffee which is 2/3 caffeine and one third decaf and also drinks tea, mostly decaf as they make it at home.  REVIEW OF SYSTEMS: Out of a complete 14 system review of symptoms, the patient complains only of the following symptoms, and all other reviewed systems are negative.  See HPI  ALLERGIES: No Known Allergies  HOME MEDICATIONS: Outpatient Medications Prior to Visit  Medication Sig Dispense Refill   amLODipine  (NORVASC ) 5 MG tablet Take 1 tablet (5 mg total) by mouth 2 (two) times daily. 60 tablet 4   chlorhexidine (PERIDEX) 0.12 % solution PRN     ciclopirox  (LOPROX ) 0.77 % cream Apply 0.77 % topically 2 (two) times daily.     Coenzyme Q10-Vitamin E (QUNOL ULTRA COQ10 PO) Take 1 capsule by mouth daily.     ezetimibe  (ZETIA ) 10 MG tablet Take 1 tablet (10 mg total) by mouth daily. 90 tablet 3   Fexofenadine HCl (ALLEGRA ALLERGY PO) Take 1 Dose by mouth as needed.     finasteride (PROSCAR) 5 MG tablet Take 5 mg by mouth daily.  3   fluticasone (FLONASE) 50 MCG/ACT nasal spray Place 1 spray into both nostrils as needed.     HYDROcodone -acetaminophen  (NORCO/VICODIN) 5-325 MG tablet Take 1 tablet by mouth every 6 (six) hours as needed for moderate pain  (pain score 4-6). 20 tablet 0   lisinopril  (ZESTRIL ) 20 MG tablet Take 1 tablet (20 mg total) by mouth daily. 90 tablet 3   ondansetron  (ZOFRAN ) 4 MG tablet Take 1 tablet (4 mg total) by mouth every 8 (eight) hours as needed for vomiting or nausea. 20 tablet 0   pantoprazole (PROTONIX) 40 MG tablet Take 40 mg by mouth daily.  SODIUM FLUORIDE 5000 PPM 1.1 % PSTE Take 1.1 1e11 Vector Genomes by mouth See admin instructions.     tamsulosin (FLOMAX) 0.4 MG CAPS capsule Take 0.8 mg by mouth at bedtime.  3   traMADol (ULTRAM) 50 MG tablet Take 50 mg by mouth as needed.     gabapentin (NEURONTIN) 300 MG capsule Take 300 mg by mouth daily.     rOPINIRole  (REQUIP  XL) 2 MG 24 hr tablet Take 1 tablet (2 mg total) by mouth at bedtime. 90 tablet 1   No facility-administered medications prior to visit.    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  01/2010 LHC with minimal nonobstructive CAD b.qualifier: Diagnosis of  By: Bevin Bucks, RN, BSN, Lauren   BPH (benign prostatic hyperplasia)    Cataract    Essential (primary) hypertension    GERD (gastroesophageal reflux disease)    Mixed hyperlipidemia    Has expereinced some intolerance to statins in the past   Pericarditis    Restless leg syndrome    Sleep apnea     PAST SURGICAL HISTORY: Past Surgical History:  Procedure Laterality Date   CARDIAC CATHETERIZATION     KNEE ARTHROSCOPY WITH MEDIAL MENISECTOMY Left 04/12/2023   Procedure: KNEE ARTHROSCOPY WITH PARTIAL MEDIAL MENISECTOMY;  Surgeon: Janeth Medicus, MD;  Location: [facility];  Service: Orthopedics;  Laterality: Left;    FAMILY HISTORY: Family History  Problem Relation Age of Onset   Hypertension Mother    Esophageal cancer Father    Hypertension Father    Restless legs syndrome Sister    Colon cancer Neg Hx    Rectal cancer Neg Hx    Stomach cancer Neg Hx    Colon polyps Neg Hx     SOCIAL HISTORY: Social  History   Socioeconomic History   Marital status: Married    Spouse name: Not on file   Number of children: Not on file   Years of education: Not on file   Highest education level: Not on file  Occupational History   Not on file  Tobacco Use   Smoking status: Never   Smokeless tobacco: Former  Advertising account planner   Vaping status: Never Used  Substance and Sexual Activity   Alcohol use: No   Drug use: Never   Sexual activity: Not on file  Other Topics Concern   Not on file  Social History Narrative   Right handed   Farmer   Caffiene-2 cups daily   Social Drivers of Health   Financial Resource Strain: Not on file  Food Insecurity: Low Risk  (01/10/2023)   Received from [facility]   Hunger Vital Sign    Within the past 12 months, you worried that your food would run out before you got money to buy more: Never true    Within the past 12 months, the food you bought just didn't last and you didn't have money to get more. : Never true  Transportation Needs: No Transportation Needs (01/10/2023)   Received from [facility]    In the past 12 months, has lack of reliable transportation kept you from medical appointments, meetings, work or from getting things needed for daily living? : No  Physical Activity: Not on file  Stress: Not on file  Social Connections: Not on file  Intimate Partner Violence: Not on file   PHYSICAL EXAM  Vitals:   07/24/23 0745  BP: 136/68  Weight: 200 lb (90.7 kg)  Height: 5' 9 (1.753 m)   Body mass index is 29.53 kg/m.  Generalized: Well developed, in no acute distress  Neurological examination  Mentation: Alert oriented to time, place, history taking. Follows all commands speech and language fluent Cranial nerve II-XII: Pupils were equal round reactive to light. Extraocular movements were full, visual field were full on confrontational test. Facial sensation and strength were normal.  Head turning and shoulder shrug  were normal and  symmetric. Motor: The motor testing reveals 5 over 5 strength of all 4 extremities. Good symmetric motor tone is noted throughout.  Sensory: Sensory testing is intact to soft touch on all 4 extremities. No evidence of extinction is noted.  Coordination: Cerebellar testing reveals good finger-nose-finger and heel-to-shin bilaterally.  Gait and station: Gait is normal.   DIAGNOSTIC DATA (LABS, IMAGING, TESTING) - I reviewed patient records, labs, notes, testing and imaging myself where available.  Lab Results  Component Value Date   WBC 8.2 07/15/2023   HGB 14.2 07/15/2023   HCT 43.5 07/15/2023   MCV 97 07/15/2023   PLT 327 07/15/2023      Component Value Date/Time   NA 134 07/15/2023 1100   K 5.0 07/15/2023 1100   CL 94 (L) 07/15/2023 1100   CO2 22 07/15/2023 1100   GLUCOSE 91 07/15/2023 1100   GLUCOSE 108 (H) 04/02/2023 0911   BUN 10 07/15/2023 1100   CREATININE 0.75 (L) 07/15/2023 1100   CREATININE 0.79 04/02/2023 0911   CALCIUM  9.5 07/15/2023 1100   PROT 7.0 04/02/2023 0911   ALBUMIN 4.4 04/02/2023 0911   AST 23 04/02/2023 0911   ALT 29 04/02/2023 0911   ALKPHOS 84 04/02/2023 0911   BILITOT 1.2 04/02/2023 0911   GFRNONAA >60 04/02/2023 0911   No results found for: CHOL, HDL, LDLCALC, LDLDIRECT, TRIG, CHOLHDL Lab Results  Component Value Date   HGBA1C 5.3 07/15/2023   Lab Results  Component Value Date   VITAMINB12 343 04/02/2023   Lab Results  Component Value Date   TSH 2.780 05/01/2022    Jeanmarie Millet, AGNP-C, DNP 07/24/2023, 8:45 AM [facility] [address], [address] [place], [place] [address] [phone]

## 2023-07-24 NOTE — Patient Instructions (Addendum)
 Continue using CPAP. You are doing great!!! Continue using nightly more than 4 hours. Continue current settings.  Try the lower strength of gabapentin to see how things go   Consider Nidra device

## 2023-07-25 ENCOUNTER — Ambulatory Visit: Payer: Self-pay | Admitting: Neurology

## 2023-07-25 LAB — IRON,TIBC AND FERRITIN PANEL
Ferritin: 197 ng/mL (ref 30–400)
Iron Saturation: 25 % (ref 15–55)
Iron: 76 ug/dL (ref 38–169)
Total Iron Binding Capacity: 304 ug/dL (ref 250–450)
UIBC: 228 ug/dL (ref 111–343)

## 2023-08-05 DIAGNOSIS — G4733 Obstructive sleep apnea (adult) (pediatric): Secondary | ICD-10-CM | POA: Diagnosis not present

## 2023-08-06 DIAGNOSIS — G4733 Obstructive sleep apnea (adult) (pediatric): Secondary | ICD-10-CM | POA: Diagnosis not present

## 2023-08-21 DIAGNOSIS — Z961 Presence of intraocular lens: Secondary | ICD-10-CM | POA: Diagnosis not present

## 2023-08-26 DIAGNOSIS — D485 Neoplasm of uncertain behavior of skin: Secondary | ICD-10-CM | POA: Diagnosis not present

## 2023-08-26 DIAGNOSIS — L853 Xerosis cutis: Secondary | ICD-10-CM | POA: Diagnosis not present

## 2023-08-26 DIAGNOSIS — D2362 Other benign neoplasm of skin of left upper limb, including shoulder: Secondary | ICD-10-CM | POA: Diagnosis not present

## 2023-08-26 DIAGNOSIS — D2271 Melanocytic nevi of right lower limb, including hip: Secondary | ICD-10-CM | POA: Diagnosis not present

## 2023-08-26 DIAGNOSIS — L820 Inflamed seborrheic keratosis: Secondary | ICD-10-CM | POA: Diagnosis not present

## 2023-08-26 DIAGNOSIS — L821 Other seborrheic keratosis: Secondary | ICD-10-CM | POA: Diagnosis not present

## 2023-08-26 DIAGNOSIS — L570 Actinic keratosis: Secondary | ICD-10-CM | POA: Diagnosis not present

## 2023-09-08 ENCOUNTER — Encounter: Payer: Self-pay | Admitting: Hematology

## 2023-09-09 DIAGNOSIS — N410 Acute prostatitis: Secondary | ICD-10-CM | POA: Diagnosis not present

## 2023-09-09 DIAGNOSIS — R5383 Other fatigue: Secondary | ICD-10-CM | POA: Diagnosis not present

## 2023-09-09 DIAGNOSIS — R5381 Other malaise: Secondary | ICD-10-CM | POA: Diagnosis not present

## 2023-10-09 DIAGNOSIS — H04123 Dry eye syndrome of bilateral lacrimal glands: Secondary | ICD-10-CM | POA: Diagnosis not present

## 2023-10-09 DIAGNOSIS — Z961 Presence of intraocular lens: Secondary | ICD-10-CM | POA: Diagnosis not present

## 2023-10-14 ENCOUNTER — Encounter: Payer: Self-pay | Admitting: Hematology

## 2023-10-14 ENCOUNTER — Ambulatory Visit: Payer: Medicare Other | Admitting: Family Medicine

## 2023-11-11 ENCOUNTER — Encounter: Payer: Self-pay | Admitting: Cardiovascular Disease

## 2023-11-11 ENCOUNTER — Inpatient Hospital Stay: Attending: Hematology

## 2023-11-11 ENCOUNTER — Other Ambulatory Visit: Payer: Self-pay

## 2023-11-11 DIAGNOSIS — D509 Iron deficiency anemia, unspecified: Secondary | ICD-10-CM | POA: Insufficient documentation

## 2023-11-11 DIAGNOSIS — E782 Mixed hyperlipidemia: Secondary | ICD-10-CM

## 2023-11-11 LAB — CBC WITH DIFFERENTIAL (CANCER CENTER ONLY)
Abs Immature Granulocytes: 0.02 K/uL (ref 0.00–0.07)
Basophils Absolute: 0.1 K/uL (ref 0.0–0.1)
Basophils Relative: 1 %
Eosinophils Absolute: 0.4 K/uL (ref 0.0–0.5)
Eosinophils Relative: 5 %
HCT: 41.6 % (ref 39.0–52.0)
Hemoglobin: 14.3 g/dL (ref 13.0–17.0)
Immature Granulocytes: 0 %
Lymphocytes Relative: 30 %
Lymphs Abs: 2.5 K/uL (ref 0.7–4.0)
MCH: 31.6 pg (ref 26.0–34.0)
MCHC: 34.4 g/dL (ref 30.0–36.0)
MCV: 91.8 fL (ref 80.0–100.0)
Monocytes Absolute: 1 K/uL (ref 0.1–1.0)
Monocytes Relative: 12 %
Neutro Abs: 4.4 K/uL (ref 1.7–7.7)
Neutrophils Relative %: 52 %
Platelet Count: 284 K/uL (ref 150–400)
RBC: 4.53 MIL/uL (ref 4.22–5.81)
RDW: 11.5 % (ref 11.5–15.5)
WBC Count: 8.4 K/uL (ref 4.0–10.5)
nRBC: 0 % (ref 0.0–0.2)

## 2023-11-11 LAB — CMP (CANCER CENTER ONLY)
ALT: 35 U/L (ref 0–44)
AST: 27 U/L (ref 15–41)
Albumin: 4.4 g/dL (ref 3.5–5.0)
Alkaline Phosphatase: 86 U/L (ref 38–126)
Anion gap: 4 — ABNORMAL LOW (ref 5–15)
BUN: 20 mg/dL (ref 8–23)
CO2: 30 mmol/L (ref 22–32)
Calcium: 10 mg/dL (ref 8.9–10.3)
Chloride: 102 mmol/L (ref 98–111)
Creatinine: 0.76 mg/dL (ref 0.61–1.24)
GFR, Estimated: >60 mL/min (ref >60)
Glucose, Bld: 72 mg/dL (ref 70–99)
Potassium: 4.4 mmol/L (ref 3.5–5.1)
Sodium: 136 mmol/L (ref 135–145)
Total Bilirubin: 1.5 mg/dL — ABNORMAL HIGH (ref 0.0–1.2)
Total Protein: 7 g/dL (ref 6.5–8.1)

## 2023-11-11 LAB — FERRITIN: Ferritin: 228 ng/mL (ref 24–336)

## 2023-11-11 LAB — IRON AND IRON BINDING CAPACITY (CC-WL,HP ONLY)
Iron: 134 ug/dL (ref 45–182)
Saturation Ratios: 39 % (ref 17.9–39.5)
TIBC: 342 ug/dL (ref 250–450)
UIBC: 208 ug/dL (ref 117–376)

## 2023-11-27 DIAGNOSIS — H2513 Age-related nuclear cataract, bilateral: Secondary | ICD-10-CM | POA: Diagnosis not present

## 2023-12-03 DIAGNOSIS — H25811 Combined forms of age-related cataract, right eye: Secondary | ICD-10-CM | POA: Diagnosis not present

## 2023-12-03 DIAGNOSIS — H2511 Age-related nuclear cataract, right eye: Secondary | ICD-10-CM | POA: Diagnosis not present

## 2023-12-04 ENCOUNTER — Telehealth: Payer: Self-pay | Admitting: *Deleted

## 2023-12-04 ENCOUNTER — Telehealth: Payer: Self-pay | Admitting: Internal Medicine

## 2023-12-04 NOTE — Telephone Encounter (Signed)
 Patient had conflict w/ 11/07 appt .  Scheduled 12/16/23 w/ Ellouise

## 2023-12-04 NOTE — Telephone Encounter (Signed)
 Inbound all from patient daughter requesting f/u call at her work cell at [phone] in regards to her father experiencing upper abdominal pain with nausea.requesting a sooner apt than December. Please advise.

## 2023-12-04 NOTE — Telephone Encounter (Signed)
 Spoke with patient's daughter.  Patient has been experiencing upper abdominal pain, improving after vomiting.  Patient had been prescribed Carafate by PCP but not taking as slurry. Instructed how to take Carafate as a slurry up to 4 times/day one hour before or two hours after meals. Made appt w/ Ellouise, [place] 12/13/23. Daughter voiced understanding.

## 2023-12-16 ENCOUNTER — Encounter: Payer: Self-pay | Admitting: Physician Assistant

## 2023-12-16 ENCOUNTER — Other Ambulatory Visit

## 2023-12-16 ENCOUNTER — Encounter: Payer: Self-pay | Admitting: Hematology

## 2023-12-16 ENCOUNTER — Ambulatory Visit: Admitting: Physician Assistant

## 2023-12-16 ENCOUNTER — Ambulatory Visit: Payer: Self-pay | Admitting: Physician Assistant

## 2023-12-16 VITALS — BP 124/72 | HR 65 | Ht 69.0 in | Wt 204.0 lb

## 2023-12-16 DIAGNOSIS — R1013 Epigastric pain: Secondary | ICD-10-CM

## 2023-12-16 DIAGNOSIS — R112 Nausea with vomiting, unspecified: Secondary | ICD-10-CM | POA: Diagnosis not present

## 2023-12-16 DIAGNOSIS — R1084 Generalized abdominal pain: Secondary | ICD-10-CM

## 2023-12-16 DIAGNOSIS — K219 Gastro-esophageal reflux disease without esophagitis: Secondary | ICD-10-CM

## 2023-12-16 LAB — CBC WITH DIFFERENTIAL/PLATELET
Basophils Absolute: 0 K/uL (ref 0.0–0.1)
Basophils Relative: 0.6 % (ref 0.0–3.0)
Eosinophils Absolute: 0.4 K/uL (ref 0.0–0.7)
Eosinophils Relative: 5.8 % — ABNORMAL HIGH (ref 0.0–5.0)
HCT: 42.3 % (ref 39.0–52.0)
Hemoglobin: 14.5 g/dL (ref 13.0–17.0)
Lymphocytes Relative: 36.5 % (ref 12.0–46.0)
Lymphs Abs: 2.6 K/uL (ref 0.7–4.0)
MCHC: 34.4 g/dL (ref 30.0–36.0)
MCV: 92.5 fl (ref 78.0–100.0)
Monocytes Absolute: 0.7 K/uL (ref 0.1–1.0)
Monocytes Relative: 9.9 % (ref 3.0–12.0)
Neutro Abs: 3.4 K/uL (ref 1.4–7.7)
Neutrophils Relative %: 47.2 % (ref 43.0–77.0)
Platelets: 279 K/uL (ref 150.0–400.0)
RBC: 4.57 Mil/uL (ref 4.22–5.81)
RDW: 12 % (ref 11.5–15.5)
WBC: 7.1 K/uL (ref 4.0–10.5)

## 2023-12-16 LAB — COMPREHENSIVE METABOLIC PANEL WITH GFR
ALT: 24 U/L (ref 0–53)
AST: 21 U/L (ref 0–37)
Albumin: 4.3 g/dL (ref 3.5–5.2)
Alkaline Phosphatase: 96 U/L (ref 39–117)
BUN: 14 mg/dL (ref 6–23)
CO2: 27 meq/L (ref 19–32)
Calcium: 8.8 mg/dL (ref 8.4–10.5)
Chloride: 101 meq/L (ref 96–112)
Creatinine, Ser: 0.86 mg/dL (ref 0.40–1.50)
GFR: 82.99 mL/min (ref >60.00)
Glucose, Bld: 92 mg/dL (ref 70–99)
Potassium: 4.1 meq/L (ref 3.5–5.1)
Sodium: 135 meq/L (ref 135–145)
Total Bilirubin: 1 mg/dL (ref 0.2–1.2)
Total Protein: 6.9 g/dL (ref 6.0–8.3)

## 2023-12-16 LAB — LIPASE: Lipase: 36 U/L (ref 11.0–59.0)

## 2023-12-16 NOTE — Patient Instructions (Addendum)
 Your provider has requested that you go to the basement level for lab work before leaving today. Press B on the elevator. The lab is located at the first door on the left as you exit the elevator.  You have been scheduled for an abdominal ultrasound at [facility] [place]  on 12/24/23 at 9:30am. Please arrive 30 minutes prior to your appointment for registration. Make certain not to have anything to eat or drink 6 hours prior to your appointment. Should you need to reschedule your appointment, please contact radiology at [phone]. This test typically takes about 30 minutes to perform.  _______________________________________________________  If your blood pressure at your visit was 140/90 or greater, please contact your primary care physician to follow up on this.  _______________________________________________________  If you are age 60 or older, your body mass index should be between 23-30. Your Body mass index is 29.53 kg/m. If this is out of the aforementioned range listed, please consider follow up with your Primary Care Provider.  If you are age 83 or younger, your body mass index should be between 19-25. Your Body mass index is 29.53 kg/m. If this is out of the aformentioned range listed, please consider follow up with your Primary Care Provider.   ________________________________________________________  The [facility] providers would like to encourage you to use MYCHART to communicate with providers for non-urgent requests or questions.  Due to long hold times on the telephone, sending your provider a message by [facility] may be a faster and more efficient way to get a response.  Please allow 48 business hours for a response.  Please remember that this is for non-urgent requests.  _______________________________________________________  [facility] is using a team-based approach to care.  Your team is made up of your doctor and two to three APPS. Our APPS (Nurse  Practitioners and Physician Assistants) work with your physician to ensure care continuity for you. They are fully qualified to address your health concerns and develop a treatment plan. They communicate directly with your gastroenterologist to care for you. Seeing the Advanced Practice Practitioners on your physician's team can help you by facilitating care more promptly, often allowing for earlier appointments, access to diagnostic testing, procedures, and other specialty referrals.

## 2023-12-16 NOTE — Progress Notes (Signed)
 Randy Console, PA-C [address] [place], [place]  [address] Phone: [phone]   Primary Care Physician: Randy Ramos LABOR, MD  Primary Gastroenterologist:  Randy Console, PA-C / Dr. Gordy Ramos   Chief Complaint: Upper abdominal pain, vomiting       HPI:   Discussed the use of AI scribe software for clinical note transcription with the patient, who gave verbal consent to proceed.  Randy Ramos is a 78 year old male who presents with intermittent episodes of upper abdominal pain with vomiting.  Was recently started on Carafate by his PCP.   Also takes pantoprazole 40 mg once daily.  These medications have helped.  He has no abdominal pain today.  He is here with his wife.  11/11/2023 labs normal CBC, CMP, ferritin, and iron panel.  No recent abdominal imaging.  Still has gallbladder.  History of Present Illness He has been experiencing intermittent episodes of upper epigastric abdominal pain over the past three months, with two significant episodes during this period. The pain is described as dull and radiating across the mid-lower chest, lasting several hours. It is severe enough that he has induced vomiting to relieve it, although vomiting does not immediately stop the pain. Certain foods and drinks, such as coffee, tea, and chocolate, trigger the pain.  He recalls a similar episode two years ago after consuming a large amount of body armor and [facility], which lasted about ten hours. During that episode, he was taken to the emergency room by his daughter. He also mentions a history of restless legs for which he takes Requip , and he suspects that a pill may have lodged in his esophagus during a recent episode, exacerbating the pain.  He has a history of mild gastritis diagnosed via an upper endoscopy performed in August 2024. Biopsies for H. pylori and celiac disease were negative. He has been taking pantoprazole 40 mg once a day, although he has been inconsistent with this  medication due to concerns about iron absorption. He also takes Carafate occasionally, finding it difficult to adhere to the recommended four times a day dosing schedule.  He reports a history of low ferritin levels about a year ago, for which he received iron infusions. He is not currently on iron supplements. No vomiting blood or black tarry stools. He also mentions a history of taking BC and Goody powders, which he has since stopped due to their potential to cause stomach issues.  Reports coughing after eating certain foods.  09/2022 EGD by Dr. Starch: Mild gastritis.  A few diminutive benign fundic gland  gastric polyps.  Normal esophagus and duodenum.  Biopsies negative for celiac and H. pylori.  09/2022 colonoscopy by Dr. Starch: 4 small (5 mm to 10 mm) polyps removed.  Pathology showed sessile serrated and tubular adenoma polyps with mild dysplasia.  Mild diverticulosis.  Good prep.  3 year repeat (due 09/2025).    Current Outpatient Medications  Medication Sig Dispense Refill   amLODipine  (NORVASC ) 5 MG tablet Take 1 tablet (5 mg total) by mouth 2 (two) times daily. 60 tablet 4   chlorhexidine (PERIDEX) 0.12 % solution PRN     ciclopirox  (LOPROX ) 0.77 % cream Apply 0.77 % topically 2 (two) times daily. (Patient taking differently: Apply topically 2 (two) times daily. As needed)     Coenzyme Q10-Vitamin E (QUNOL ULTRA COQ10 PO) Take 1 capsule by mouth daily.     ezetimibe  (ZETIA ) 10 MG tablet Take 1 tablet (  10 mg total) by mouth daily. 90 tablet 3   Fexofenadine HCl (ALLEGRA ALLERGY PO) Take 1 Dose by mouth as needed.     finasteride (PROSCAR) 5 MG tablet Take 5 mg by mouth daily.  3   fluticasone (FLONASE) 50 MCG/ACT nasal spray Place 1 spray into both nostrils as needed.     gabapentin  (NEURONTIN ) 100 MG capsule Take 1 capsule (100 mg total) by mouth 4 (four) times daily. 360 capsule 3   HYDROcodone -acetaminophen  (NORCO/VICODIN) 5-325 MG tablet Take 1 tablet by mouth every 6 (six) hours  as needed for moderate pain (pain score 4-6). 20 tablet 0   lisinopril  (ZESTRIL ) 20 MG tablet Take 1 tablet (20 mg total) by mouth daily. 90 tablet 3   ondansetron  (ZOFRAN ) 4 MG tablet Take 1 tablet (4 mg total) by mouth every 8 (eight) hours as needed for vomiting or nausea. 20 tablet 0   pantoprazole (PROTONIX) 40 MG tablet Take 40 mg by mouth daily.     rOPINIRole  (REQUIP  XL) 2 MG 24 hr tablet Take 1 tablet (2 mg total) by mouth at bedtime. 90 tablet 3   SODIUM FLUORIDE 5000 PPM 1.1 % PSTE Take 1.1 1e11 Vector Genomes by mouth See admin instructions.     tamsulosin (FLOMAX) 0.4 MG CAPS capsule Take 0.8 mg by mouth at bedtime.  3   traMADol (ULTRAM) 50 MG tablet Take 50 mg by mouth as needed.     No current facility-administered medications for this visit.    Allergies as of 12/16/2023   (No Known Allergies)    Past Medical History:  Diagnosis Date   ANGINA, STABLE 01/11/2010   A.  01/2010 LHC with minimal nonobstructive CAD b.qualifier: Diagnosis of  By: Delores, RN, BSN, Randy Ramos   BPH (benign prostatic hyperplasia)    Cataract    Essential (primary) hypertension    GERD (gastroesophageal reflux disease)    Mixed hyperlipidemia    Has expereinced some intolerance to statins in the past   Pericarditis    Restless leg syndrome    Sleep apnea     Past Surgical History:  Procedure Laterality Date   CARDIAC CATHETERIZATION     KNEE ARTHROSCOPY WITH MEDIAL MENISECTOMY Left 04/12/2023   Procedure: KNEE ARTHROSCOPY WITH PARTIAL MEDIAL MENISECTOMY;  Surgeon: Randy Selinda Dover, MD;  Location: [facility];  Service: Orthopedics;  Laterality: Left;    Review of Systems:    All systems reviewed and negative except where noted in HPI.    Physical Exam:  BP 124/72   Pulse 65   Ht 5' 9 (1.753 m)   Wt 204 lb (92.5 kg)   BMI 30.13 kg/m  No LMP for male patient.  General: Well-nourished, well-developed in no acute distress.  Lungs: Clear to auscultation bilaterally.  Non-labored. Heart: Regular rate and rhythm, no murmurs rubs or gallops.  Abdomen: Bowel sounds are normal; Abdomen is Soft; No hepatosplenomegaly, masses or hernias;  No Abdominal Tenderness; No guarding or rebound tenderness. Neuro: Alert and oriented x 3.  Grossly intact.  Psych: Alert and cooperative, normal mood and affect.   Imaging Studies: No results found.  Labs: CBC    Component Value Date/Time   WBC 8.4 11/11/2023 0931   WBC 8.7 07/19/2022 1436   RBC 4.53 11/11/2023 0931   HGB 14.3 11/11/2023 0931   HGB 14.2 07/15/2023 1100   HCT 41.6 11/11/2023 0931   HCT 43.5 07/15/2023 1100   PLT 284 11/11/2023 0931   PLT 327 07/15/2023  1100   MCV 91.8 11/11/2023 0931   MCV 97 07/15/2023 1100   MCH 31.6 11/11/2023 0931   MCHC 34.4 11/11/2023 0931   RDW 11.5 11/11/2023 0931   RDW 12.0 07/15/2023 1100   LYMPHSABS 2.5 11/11/2023 0931   MONOABS 1.0 11/11/2023 0931   EOSABS 0.4 11/11/2023 0931   BASOSABS 0.1 11/11/2023 0931    CMP     Component Value Date/Time   NA 136 11/11/2023 0931   NA 134 07/15/2023 1100   K 4.4 11/11/2023 0931   CL 102 11/11/2023 0931   CO2 30 11/11/2023 0931   GLUCOSE 72 11/11/2023 0931   BUN 20 11/11/2023 0931   BUN 10 07/15/2023 1100   CREATININE 0.76 11/11/2023 0931   CALCIUM  10.0 11/11/2023 0931   PROT 7.0 11/11/2023 0931   ALBUMIN 4.4 11/11/2023 0931   AST 27 11/11/2023 0931   ALT 35 11/11/2023 0931   ALKPHOS 86 11/11/2023 0931   BILITOT 1.5 (H) 11/11/2023 0931   GFRNONAA >60 11/11/2023 0931     Assessment and Plan:   Randy Ramos is a 78 y.o. y/o male presents for evaluation of:  Assessment & Plan 1.  Intermittent upper abdominal pain and vomiting with history of gastritis and gastroesophageal reflux disease.  Differential includes gallstones, pancreatitis, GERD and gastritis. Currently asymptomatic.  No abdominal tenderness on exam today. - Ordered labs: CBC, CMP, Lipase. - Ordered complete abdominal ultrasound to evaluate  gallbladder, liver, and pancreas. - Advised taking pantoprazole (Protonix) 40 mg once daily. - Recommended Carafate three times a day before meals. - Discussed potential use of Pepcid for acid reflux. - Advised avoiding NSAIDs, Goody powders and BC powders due to ulcer and bleeding risk. - Proton pump inhibitors and Carafate discussed regarding absorption issues.  2.  History of iron deficiency anemia Previous low ferritin treated with iron infusions. Current normal hemoglobin and iron panel. No current supplementation. Proton pump inhibitors discussed regarding iron absorption. - Continue to monitor hemoglobin and iron levels. - Consider capsule endoscopy if recurrent iron deficiency anemia occurs.  3.  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    Randy Console, PA-C  Follow up in 6 to 8 weeks with TG.  Also follow-up based on test results and GI symptoms.

## 2023-12-24 ENCOUNTER — Inpatient Hospital Stay (HOSPITAL_BASED_OUTPATIENT_CLINIC_OR_DEPARTMENT_OTHER): Admission: RE | Admit: 2023-12-24 | Admitting: Radiology

## 2023-12-25 ENCOUNTER — Other Ambulatory Visit (HOSPITAL_COMMUNITY)

## 2023-12-31 ENCOUNTER — Ambulatory Visit (HOSPITAL_BASED_OUTPATIENT_CLINIC_OR_DEPARTMENT_OTHER)
Admission: RE | Admit: 2023-12-31 | Discharge: 2023-12-31 | Disposition: A | Source: Ambulatory Visit | Attending: Physician Assistant | Admitting: Physician Assistant

## 2023-12-31 DIAGNOSIS — R112 Nausea with vomiting, unspecified: Secondary | ICD-10-CM

## 2023-12-31 DIAGNOSIS — R1084 Generalized abdominal pain: Secondary | ICD-10-CM

## 2023-12-31 DIAGNOSIS — R1013 Epigastric pain: Secondary | ICD-10-CM

## 2024-01-31 ENCOUNTER — Encounter: Payer: Self-pay | Admitting: Hematology

## 2024-02-02 NOTE — Progress Notes (Unsigned)
 "     [name], PA-C [address] [place], [place]  [address] Phone: [phone]   Primary Care Physician: Silver Lamar LABOR, MD  Primary Gastroenterologist:  [name], PA-C / Dr. Gordy Starch   Chief Complaint: Follow-up upper abdominal pain and nausea   HPI:   Discussed the use of AI scribe software for clinical note transcription with the patient, who gave verbal consent to proceed.  I last saw patient 12/16/2023 to evaluate intermittent upper abdominal pain and vomiting.  History of gastritis and GERD.  He was started on pantoprazole 40 mg once daily and Carafate 1 g 3 times daily before meals.  Here for 6-week follow-up.  12/16/2023 labs: Normal CBC (Hgb 14.5), CMP, lipase.  01/07/2024 complete abdominal ultrasound: Normal.  No gallstones.  Normal gallbladder, liver, pancreas, and kidneys.  Previous history of iron deficiency anemia treated with IV iron infusions.  Anemia resolved.  Previous history of taking BC and Goody powders which were discontinued due to gastritis.  09/2022 EGD by Dr. Starch: Mild gastritis.  A few diminutive benign fundic gland  gastric polyps.  Normal esophagus and duodenum.  Biopsies negative for celiac and H. pylori.   09/2022 colonoscopy by Dr. Starch: 4 small (5 mm to 10 mm) polyps removed.  Pathology showed sessile serrated and tubular adenoma polyps with mild dysplasia.  Mild diverticulosis.  Good prep.  3 year repeat (due 09/2025).   History of Present Illness      Current Outpatient Medications  Medication Sig Dispense Refill   amLODipine  (NORVASC ) 5 MG tablet Take 1 tablet (5 mg total) by mouth 2 (two) times daily. 60 tablet 4   chlorhexidine (PERIDEX) 0.12 % solution PRN     ciclopirox  (LOPROX ) 0.77 % cream Apply 0.77 % topically 2 (two) times daily. (Patient taking differently: Apply topically 2 (two) times daily. As needed)     Coenzyme Q10-Vitamin E (QUNOL ULTRA COQ10 PO) Take 1 capsule by mouth daily.     ezetimibe  (ZETIA )  10 MG tablet Take 1 tablet (10 mg total) by mouth daily. 90 tablet 3   Fexofenadine HCl (ALLEGRA ALLERGY PO) Take 1 Dose by mouth as needed.     finasteride (PROSCAR) 5 MG tablet Take 5 mg by mouth daily.  3   fluticasone (FLONASE) 50 MCG/ACT nasal spray Place 1 spray into both nostrils as needed.     gabapentin  (NEURONTIN ) 100 MG capsule Take 1 capsule (100 mg total) by mouth 4 (four) times daily. 360 capsule 3   HYDROcodone -acetaminophen  (NORCO/VICODIN) 5-325 MG tablet Take 1 tablet by mouth every 6 (six) hours as needed for moderate pain (pain score 4-6). 20 tablet 0   lisinopril  (ZESTRIL ) 20 MG tablet Take 1 tablet (20 mg total) by mouth daily. 90 tablet 3   ondansetron  (ZOFRAN ) 4 MG tablet Take 1 tablet (4 mg total) by mouth every 8 (eight) hours as needed for vomiting or nausea. 20 tablet 0   pantoprazole (PROTONIX) 40 MG tablet Take 40 mg by mouth daily.     rOPINIRole  (REQUIP  XL) 2 MG 24 hr tablet Take 1 tablet (2 mg total) by mouth at bedtime. 90 tablet 3   SODIUM FLUORIDE 5000 PPM 1.1 % PSTE Take 1.1 1e11 Vector Genomes by mouth See admin instructions.     tamsulosin (FLOMAX) 0.4 MG CAPS capsule Take 0.8 mg by mouth at bedtime.  3   traMADol (ULTRAM) 50 MG tablet Take 50 mg by mouth as needed.     No  current facility-administered medications for this visit.    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  01/2010 LHC with minimal nonobstructive CAD b.qualifier: Diagnosis of  By: Delores, RN, BSN, Lauren   BPH (benign prostatic hyperplasia)    Cataract    Essential (primary) hypertension    GERD (gastroesophageal reflux disease)    Mixed hyperlipidemia    Has expereinced some intolerance to statins in the past   Pericarditis    Restless leg syndrome    Sleep apnea     Past Surgical History:  Procedure Laterality Date   CARDIAC CATHETERIZATION     KNEE ARTHROSCOPY WITH MEDIAL MENISECTOMY Left  04/12/2023   Procedure: KNEE ARTHROSCOPY WITH PARTIAL MEDIAL MENISECTOMY;  Surgeon: Sharl Selinda Dover, MD;  Location: [facility];  Service: Orthopedics;  Laterality: Left;    Review of Systems:    All systems reviewed and negative except where noted in HPI.    Physical Exam:  There were no vitals taken for this visit. No LMP for male patient.  General: Well-nourished, well-developed in no acute distress.  Lungs: Clear to auscultation bilaterally. Non-labored. Heart: Regular rate and rhythm, no murmurs rubs or gallops.  Abdomen: Bowel sounds are normal; Abdomen is Soft; No hepatosplenomegaly, masses or hernias;  No Abdominal Tenderness; No guarding or rebound tenderness. Neuro: Alert and oriented x 3.  Grossly intact.  Psych: Alert and cooperative, normal mood and affect.   Imaging Studies: No results found.  Labs: CBC    Component Value Date/Time   WBC 7.1 12/16/2023 1425   RBC 4.57 12/16/2023 1425   HGB 14.5 12/16/2023 1425   HGB 14.3 11/11/2023 0931   HGB 14.2 07/15/2023 1100   HCT 42.3 12/16/2023 1425   HCT 43.5 07/15/2023 1100   PLT 279.0 12/16/2023 1425   PLT 284 11/11/2023 0931   PLT 327 07/15/2023 1100   MCV 92.5 12/16/2023 1425   MCV 97 07/15/2023 1100   MCH 31.6 11/11/2023 0931   MCHC 34.4 12/16/2023 1425   RDW 12.0 12/16/2023 1425   RDW 12.0 07/15/2023 1100   LYMPHSABS 2.6 12/16/2023 1425   MONOABS 0.7 12/16/2023 1425   EOSABS 0.4 12/16/2023 1425   BASOSABS 0.0 12/16/2023 1425    CMP     Component Value Date/Time   NA 135 12/16/2023 1425   NA 134 07/15/2023 1100   K 4.1 12/16/2023 1425   CL 101 12/16/2023 1425   CO2 27 12/16/2023 1425   GLUCOSE 92 12/16/2023 1425   BUN 14 12/16/2023 1425   BUN 10 07/15/2023 1100   CREATININE 0.86 12/16/2023 1425   CREATININE 0.76 11/11/2023 0931   CALCIUM  8.8 12/16/2023 1425   PROT 6.9 12/16/2023 1425   ALBUMIN 4.3 12/16/2023 1425   AST 21 12/16/2023 1425   AST 27 11/11/2023 0931   ALT 24  12/16/2023 1425   ALT 35 11/11/2023 0931   ALKPHOS 96 12/16/2023 1425   BILITOT 1.0 12/16/2023 1425   BILITOT 1.5 (H) 11/11/2023 0931   GFRNONAA >60 11/11/2023 0931       Assessment and Plan:   JAMALE SPANGLER is a 78 y.o. y/o male ***  Assessment and Plan Assessment & Plan       [name], PA-C  Follow up ***   "

## 2024-02-03 ENCOUNTER — Encounter: Payer: Self-pay | Admitting: Physician Assistant

## 2024-02-03 ENCOUNTER — Ambulatory Visit: Admitting: Physician Assistant

## 2024-02-03 VITALS — BP 120/62 | HR 66 | Ht 69.0 in | Wt 217.1 lb

## 2024-02-03 DIAGNOSIS — R1013 Epigastric pain: Secondary | ICD-10-CM

## 2024-02-03 DIAGNOSIS — R110 Nausea: Secondary | ICD-10-CM

## 2024-02-03 NOTE — Patient Instructions (Signed)
 We have sent the following medications to your pharmacy for you to pick up at your convenience: Pantoprazole 40 mg once daily in the morning and Carafate 1 gram twice daily before lunch and dinner  You have been scheduled for a CT scan of the abdomen and pelvis at [facility], 1st floor Radiology. You are scheduled on 02/10/24 at 10:30 am. You should arrive 15 minutes prior to your appointment time for registration. You may take any medications as prescribed with a small amount of water, if necessary. If you take any of the following medications: METFORMIN, GLUCOPHAGE, GLUCOVANCE, AVANDAMET, RIOMET, FORTAMET, ACTOPLUS MET, JANUMET, GLUMETZA or METAGLIP, you MAY be asked to HOLD this medication 48 hours AFTER the exam. The purpose of you drinking the oral contrast is to aid in the visualization of your intestinal tract. The contrast solution may cause some diarrhea. Depending on your individual set of symptoms, you may also receive an intravenous injection of x-ray contrast/dye. Plan on being at [facility] for 45 minutes or longer, depending on the type of exam you are having performed. If you have any questions regarding your exam or if you need to reschedule, you may call [facility] at [phone] between the hours of 8:00 am and 5:00 pm, Monday-Friday.   You have been scheduled for a HIDA scan at [facility] (1st floor) on 02/11/24. Please arrive 30 minutes prior to your scheduled appointment at  9:00 am. Make certain not to have anything to eat or drink at least 6 hours prior to your test. Should this appointment date or time not work well for you, please call radiology scheduling at [phone].  _____________________________________________________________________ hepatobiliary (HIDA) scan is an imaging procedure used to diagnose problems in the liver, gallbladder and bile ducts. In the HIDA scan, a radioactive chemical or tracer is injected into a vein in your arm. The  tracer is handled by the liver like bile. Bile is a fluid produced and excreted by your liver that helps your digestive system break down fats in the foods you eat. Bile is stored in your gallbladder and the gallbladder releases the bile when you eat a meal. A special nuclear medicine scanner (gamma camera) tracks the flow of the tracer from your liver into your gallbladder and small intestine.  During your HIDA scan  You'll be asked to change into a hospital gown before your HIDA scan begins. Your health care team will position you on a table, usually on your back. The radioactive tracer is then injected into a vein in your arm.The tracer travels through your bloodstream to your liver, where it's taken up by the bile-producing cells. The radioactive tracer travels with the bile from your liver into your gallbladder and through your bile ducts to your small intestine.You may feel some pressure while the radioactive tracer is injected into your vein. As you lie on the table, a special gamma camera is positioned over your abdomen taking pictures of the tracer as it moves through your body. The gamma camera takes pictures continually for about an hour. You'll need to keep still during the HIDA scan. This can become uncomfortable, but you may find that you can lessen the discomfort by taking deep breaths and thinking about other things. Tell your health care team if you're uncomfortable. The radiologist will watch on a computer the progress of the radioactive tracer through your body. The HIDA scan may be stopped when the radioactive tracer is seen in the gallbladder and enters your  small intestine. This typically takes about an hour. In some cases extra imaging will be performed if original images aren't satisfactory, if morphine is given to help visualize the gallbladder or if the medication CCK is given to look at the contraction of the gallbladder. This test typically takes 2 hours to  complete. ________________________________________________________________________  Please follow up sooner if symptoms increase or worsen  Due to recent changes in healthcare laws, you may see the results of your imaging and laboratory studies on MyChart before your provider has had a chance to review them.  We understand that in some cases there may be results that are confusing or concerning to you. Not all laboratory results come back in the same time frame and the provider may be waiting for multiple results in order to interpret others.  Please give us  48 hours in order for your provider to thoroughly review all the results before contacting the office for clarification of your results.   Thank you for trusting me with your gastrointestinal care!   [name], PA-C _______________________________________________________  If your blood pressure at your visit was 140/90 or greater, please contact your primary care physician to follow up on this.  _______________________________________________________  If you are age 78 or older, your body mass index should be between 23-30. Your Body mass index is 32.06 kg/m. If this is out of the aforementioned range listed, please consider follow up with your Primary Care Provider.  If you are age 78 or younger, your body mass index should be between 19-25. Your Body mass index is 32.06 kg/m. If this is out of the aformentioned range listed, please consider follow up with your Primary Care Provider.   ________________________________________________________  The [facility] providers would like to encourage you to use MYCHART to communicate with providers for non-urgent requests or questions.  Due to long hold times on the telephone, sending your provider a message by [facility] may be a faster and more efficient way to get a response.  Please allow 48 business hours for a response.  Please remember that this is for non-urgent requests.   _______________________________________________________

## 2024-02-05 ENCOUNTER — Encounter: Payer: Self-pay | Admitting: Pharmacist Clinician (PhC)/ Clinical Pharmacy Specialist

## 2024-02-05 ENCOUNTER — Other Ambulatory Visit (HOSPITAL_COMMUNITY): Payer: Self-pay

## 2024-02-05 ENCOUNTER — Telehealth: Payer: Self-pay | Admitting: Pharmacist Clinician (PhC)/ Clinical Pharmacy Specialist

## 2024-02-05 ENCOUNTER — Telehealth: Payer: Self-pay | Admitting: Pharmacy Technician

## 2024-02-05 ENCOUNTER — Ambulatory Visit: Attending: Internal Medicine | Admitting: Pharmacist Clinician (PhC)/ Clinical Pharmacy Specialist

## 2024-02-05 ENCOUNTER — Encounter: Payer: Self-pay | Admitting: Hematology

## 2024-02-05 DIAGNOSIS — E785 Hyperlipidemia, unspecified: Secondary | ICD-10-CM | POA: Diagnosis not present

## 2024-02-05 NOTE — Progress Notes (Signed)
 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 male patient of Dr Fell, in the office today to discuss options for cholesterol management.  Insurance Carrier: [employer] [phone]   $100 deductilbe - first month $213, then $138/month (25%)  Pharmacy:  [facility] [address]   Healthwell:    yes (wife has, also on Repatha)  LDL Cholesterol goal:  LDL < 70  Current Medications:  ezetimibe  10 mg daily   Previously tried:  rosuvastatin  - myalgias  Family Hx: mother had MI in her 41's lived to 63; father had stoke at 14 died, brother had hole in his heart, repaired at age 57; daughter with high cholesterol     Social Hx: Tobacco: no Alcohol:   no   Diet:    eating out more over th holidays; more fast foods;  few vegetables; holiday cookies and candy; hopes to get away from this in the new year; normally snacks on high high protein bars; [employer] protein drinks   Exercise: [place], [place] all day   Accessory Clinical Findings   In CareEverywhere ([facility])  01/28/24:  TC 164, TG 116, HDL 43, LDL 100  No results found for: LIPOA  Lab Results  Component Value Date   ALT 24 12/16/2023   AST 21 12/16/2023   ALKPHOS 96 12/16/2023   BILITOT 1.0 12/16/2023   Lab Results  Component Value Date   CREATININE 0.86 12/16/2023   BUN 14 12/16/2023   NA 135 12/16/2023   K 4.1 12/16/2023   CL 101 12/16/2023   CO2 27 12/16/2023   Lab Results  Component Value  Date   HGBA1C 5.3 07/15/2023    Home Medications    Current Outpatient Medications  Medication Sig Dispense Refill   amLODipine  (NORVASC ) 5 MG tablet Take 1 tablet (5 mg total) by mouth 2 (two) times daily. (Patient not taking: Reported on 02/03/2024) 60 tablet 4   chlorhexidine (PERIDEX) 0.12 % solution PRN     ciclopirox  (LOPROX ) 0.77 % cream Apply 0.77 % topically 2 (two) times daily. (Patient taking differently: Apply topically 2 (two) times daily. As needed)     Coenzyme Q10-Vitamin E (QUNOL ULTRA COQ10 PO) Take 1 capsule by mouth daily.     ezetimibe  (ZETIA ) 10 MG tablet Take 1 tablet (10 mg total) by mouth daily. 90 tablet 3   Fexofenadine HCl (ALLEGRA ALLERGY PO) Take 1 Dose by mouth as needed.     finasteride (PROSCAR) 5 MG tablet Take 5 mg by mouth daily.  3   fluticasone (FLONASE) 50 MCG/ACT nasal spray Place 1 spray into both nostrils as needed.     gabapentin  (NEURONTIN ) 100 MG capsule Take 1 capsule (100 mg total) by mouth 4 (four) times daily. (Patient not taking: Reported on 02/03/2024) 360 capsule 3   HYDROcodone -acetaminophen  (NORCO/VICODIN) 5-325 MG tablet Take 1 tablet by mouth every 6 (six) hours as needed for moderate  pain (pain score 4-6). 20 tablet 0   lisinopril  (ZESTRIL ) 20 MG tablet Take 1 tablet (20 mg total) by mouth daily. 90 tablet 3   ondansetron  (ZOFRAN ) 4 MG tablet Take 1 tablet (4 mg total) by mouth every 8 (eight) hours as needed for vomiting or nausea. 20 tablet 0   pantoprazole (PROTONIX) 40 MG tablet Take 40 mg by mouth daily.     rOPINIRole  (REQUIP  XL) 2 MG 24 hr tablet Take 1 tablet (2 mg total) by mouth at bedtime. 90 tablet 3   SODIUM FLUORIDE 5000 PPM 1.1 % PSTE Take 1.1 1e11 Vector Genomes by mouth See admin instructions.     tamsulosin (FLOMAX) 0.4 MG CAPS capsule Take 0.8 mg by mouth at bedtime.  3   traMADol (ULTRAM) 50 MG tablet Take 50 mg by mouth as needed.     No current facility-administered medications for this visit.     Assessment &  Plan    Hyperlipidemia LDL goal <70 Assessment: Patient with CAD not at LDL goal of < 70 Most recent LDL 100 on 01/28/24 Has been compliant with ezetimibe  10 mg daily Not able to tolerate statins secondary to myalgias - rosuvatatin Reviewed options for lowering LDL cholesterol, including PCSK-9 inhibitors and inclisiran.  Discussed mechanisms of action, dosing, side effects, potential decreases in LDL cholesterol and costs.  Also reviewed potential options for patient assistance.  Plan: Patient agreeable to starting Repatha 140 mg q14d Repeat labs after:  3 months Lipid Liver function Patient was given information on [employer] - will sign patient up when PA approved Marital status Income < $72,000 (single) or < $102,000 (married)   Allean Mink, PharmD CPP [facility] [address]   [place], [place] [address] [phone]  02/05/2024, 10:05 AM       [1] No Known Allergies  "

## 2024-02-05 NOTE — Patient Instructions (Signed)
 Your Results:             Your most recent labs Goal  Total Cholesterol 164 < 200  Triglycerides 116 < 150  HDL (happy/good cholesterol) 443 > 40  LDL (lousy/bad cholesterol 100 < 70   Medication changes:  We will start the process to get Repatha covered by your insurance.  Once the prior authorization is complete, I will call/send a MyChart message to let you know and confirm pharmacy information.   You will take one injection every 14 days  Lab orders:  We want to repeat labs after 2-3 months.  We will send you a lab order to remind you once we get closer to that time.    Patient Assistance:    We will sign you up for a Healthwell Grant once your medication is approved by [employer].  I will call you with the ID number, then you will take this information to the pharmacy.  They will bill it after your insurance, bringing your copay to $0.  The grant will pay the first $2,500 in a one year period.    ID   BIN [id number]  PCN [id number]  GRP [id number]    Thank you for choosing [facility]

## 2024-02-05 NOTE — Telephone Encounter (Signed)
 Pharmacy Patient Advocate Encounter   Received notification from Physician's Office that prior authorization for Repatha is required/requested.   Insurance verification completed.   The patient is insured through [employer].   Per test claim: PA required; PA submitted to above mentioned insurance via Latent Key/confirmation #/EOC [id number] Status is pending

## 2024-02-05 NOTE — Telephone Encounter (Signed)
 Please do PA for Repatha and sign up for Healthwell. (No change to insurance in 2026)

## 2024-02-05 NOTE — Telephone Encounter (Signed)
     Healthwell grant

## 2024-02-05 NOTE — Assessment & Plan Note (Signed)
 Assessment: Patient with CAD not at LDL goal of < 70 Most recent LDL 100 on 01/28/24 Has been compliant with ezetimibe  10 mg daily Not able to tolerate statins secondary to myalgias - rosuvatatin Reviewed options for lowering LDL cholesterol, including PCSK-9 inhibitors and inclisiran.  Discussed mechanisms of action, dosing, side effects, potential decreases in LDL cholesterol and costs.  Also reviewed potential options for patient assistance.  Plan: Patient agreeable to starting Repatha 140 mg q14d Repeat labs after:  3 months Lipid Liver function Patient was given information on [employer] - will sign patient up when PA approved Marital status Income < $72,000 (single) or < $102,000 (married)

## 2024-02-07 ENCOUNTER — Encounter: Payer: Self-pay | Admitting: Pharmacist Clinician (PhC)/ Clinical Pharmacy Specialist

## 2024-02-07 ENCOUNTER — Telehealth: Payer: Self-pay | Admitting: Pharmacy Technician

## 2024-02-07 ENCOUNTER — Other Ambulatory Visit (HOSPITAL_COMMUNITY): Payer: Self-pay

## 2024-02-07 ENCOUNTER — Encounter: Payer: Self-pay | Admitting: Hematology

## 2024-02-07 DIAGNOSIS — E785 Hyperlipidemia, unspecified: Secondary | ICD-10-CM

## 2024-02-07 MED ORDER — REPATHA SURECLICK 140 MG/ML ~~LOC~~ SOAJ: 140.0000 mg | SUBCUTANEOUS | 3 refills | Status: AC

## 2024-02-07 NOTE — Telephone Encounter (Signed)
 Patient has healthwell grant on file already: also in [facility]  Card No. [id number]  Card Status Active  BIN [id number]  PCN [id number]  PC Group [id number]

## 2024-02-07 NOTE — Telephone Encounter (Signed)
 Pharmacy Patient Advocate Encounter  Received notification from [employer] that Prior Authorization for repatha has been APPROVED from 02/05/24 to 02/04/25. Ran test claim, Copay is $210.48- one month. This test claim was processed through [facility]- copay amounts may vary at other pharmacies due to pharmacy/plan contracts, or as the patient moves through the different stages of their insurance plan.   PA #/Case ID/Reference #: [id number]

## 2024-02-07 NOTE — Addendum Note (Signed)
 Addended by: Keishawna Carranza L on: 02/07/2024 01:26 PM   Modules accepted: Orders

## 2024-02-10 ENCOUNTER — Other Ambulatory Visit (HOSPITAL_BASED_OUTPATIENT_CLINIC_OR_DEPARTMENT_OTHER): Admitting: Radiology

## 2024-02-11 ENCOUNTER — Ambulatory Visit (HOSPITAL_COMMUNITY)
Admission: RE | Admit: 2024-02-11 | Discharge: 2024-02-11 | Disposition: A | Discharge status: Home / Self Care | Source: Ambulatory Visit | Attending: Physician Assistant | Admitting: Physician Assistant

## 2024-02-11 ENCOUNTER — Encounter (HOSPITAL_COMMUNITY)
Admission: RE | Admit: 2024-02-11 | Discharge: 2024-02-11 | Disposition: A | Discharge status: Home / Self Care | Source: Ambulatory Visit | Attending: Physician Assistant | Admitting: Physician Assistant

## 2024-02-11 DIAGNOSIS — R110 Nausea: Secondary | ICD-10-CM | POA: Insufficient documentation

## 2024-02-11 DIAGNOSIS — R1013 Epigastric pain: Secondary | ICD-10-CM | POA: Diagnosis present

## 2024-02-11 MED ORDER — IOHEXOL 300 MG/ML  SOLN
100.0000 mL | Freq: Once | INTRAMUSCULAR | Status: AC | PRN
  Administered 2024-02-11: 100 mL via INTRAVENOUS

## 2024-02-11 MED ORDER — TECHNETIUM TC 99M MEBROFENIN IV KIT
5.0000 | PACK | Freq: Once | INTRAVENOUS | Status: AC
  Administered 2024-02-11: 5.33 via INTRAVENOUS

## 2024-02-13 ENCOUNTER — Ambulatory Visit: Payer: Self-pay | Admitting: Physician Assistant

## 2024-03-11 ENCOUNTER — Encounter: Payer: Self-pay | Admitting: Hematology

## 2024-03-11 ENCOUNTER — Other Ambulatory Visit (HOSPITAL_BASED_OUTPATIENT_CLINIC_OR_DEPARTMENT_OTHER): Payer: Self-pay | Admitting: Family Medicine

## 2024-03-11 DIAGNOSIS — I10 Essential (primary) hypertension: Secondary | ICD-10-CM

## 2024-03-11 DIAGNOSIS — E782 Mixed hyperlipidemia: Secondary | ICD-10-CM

## 2024-04-06 ENCOUNTER — Other Ambulatory Visit: Payer: Medicare Other

## 2024-04-06 ENCOUNTER — Ambulatory Visit: Payer: Medicare Other | Admitting: Hematology

## 2024-07-27 ENCOUNTER — Ambulatory Visit: Admitting: Family Medicine
